# Patient Record
Sex: Male | Born: 1990 | Race: White | Hispanic: No | Marital: Single | Smoking: Current every day smoker
Health system: Southern US, Community
[De-identification: ages and names within clinical notes are randomized; demographics above are authoritative.]

## PROBLEM LIST (undated history)

## (undated) HISTORY — PX: TONSILLECTOMY: SUR1361

## (undated) HISTORY — PX: TYMPANOPLASTY: SHX33

## (undated) HISTORY — PX: CHEST TUBE INSERTION: SHX231

---
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
## 2012-11-16 ENCOUNTER — Emergency Department: Payer: Self-pay | Admitting: Emergency Medicine

## 2012-11-16 LAB — COMPREHENSIVE METABOLIC PANEL
Alkaline Phosphatase: 82 U/L (ref 50–136)
BUN: 13 mg/dL (ref 7–18)
Calcium, Total: 9.6 mg/dL (ref 8.5–10.1)
Creatinine: 1.33 mg/dL — ABNORMAL HIGH (ref 0.60–1.30)
Glucose: 63 mg/dL — ABNORMAL LOW (ref 65–99)
Osmolality: 278 (ref 275–301)
SGPT (ALT): 23 U/L (ref 12–78)
Sodium: 140 mmol/L (ref 136–145)

## 2012-11-16 LAB — CBC WITH DIFFERENTIAL/PLATELET
Basophil #: 0.1 10*3/uL (ref 0.0–0.1)
Basophil %: 0.9 %
Eosinophil #: 0.2 10*3/uL (ref 0.0–0.7)
Eosinophil %: 2.5 %
HCT: 48.2 % (ref 40.0–52.0)
HGB: 16.5 g/dL (ref 13.0–18.0)
Lymphocyte #: 3.4 10*3/uL (ref 1.0–3.6)
Lymphocyte %: 38.8 %
MCH: 30.6 pg (ref 26.0–34.0)
Monocyte #: 0.6 x10 3/mm (ref 0.2–1.0)
Monocyte %: 6.9 %
Neutrophil %: 50.9 %
RBC: 5.4 10*6/uL (ref 4.40–5.90)
RDW: 13.3 % (ref 11.5–14.5)
WBC: 8.7 10*3/uL (ref 3.8–10.6)

## 2012-11-16 LAB — AMYLASE: Amylase: 54 U/L (ref 25–115)

## 2012-11-16 LAB — URINALYSIS, COMPLETE
Bacteria: NONE SEEN
Bilirubin,UR: NEGATIVE
Blood: NEGATIVE
Glucose,UR: NEGATIVE mg/dL (ref 0–75)
Ketone: NEGATIVE
Leukocyte Esterase: NEGATIVE
Ph: 7 (ref 4.5–8.0)
RBC,UR: 3 /HPF (ref 0–5)
WBC UR: NONE SEEN /HPF (ref 0–5)

## 2012-11-16 LAB — LIPASE, BLOOD: Lipase: 133 U/L (ref 73–393)

## 2013-01-17 ENCOUNTER — Emergency Department: Payer: Self-pay | Admitting: Emergency Medicine

## 2013-04-22 ENCOUNTER — Emergency Department: Payer: Self-pay | Admitting: Emergency Medicine

## 2013-08-09 ENCOUNTER — Emergency Department: Payer: Self-pay | Admitting: Emergency Medicine

## 2013-08-29 ENCOUNTER — Emergency Department: Payer: Self-pay | Admitting: Emergency Medicine

## 2014-03-04 ENCOUNTER — Emergency Department: Payer: Self-pay | Admitting: Internal Medicine

## 2014-06-19 ENCOUNTER — Encounter: Payer: Self-pay | Admitting: Emergency Medicine

## 2014-06-19 ENCOUNTER — Emergency Department
Admission: EM | Admit: 2014-06-19 | Discharge: 2014-06-19 | Disposition: A | Discharge status: Home / Self Care | Payer: Self-pay | Attending: Emergency Medicine | Admitting: Emergency Medicine

## 2014-06-19 DIAGNOSIS — Z87891 Personal history of nicotine dependence: Secondary | ICD-10-CM | POA: Insufficient documentation

## 2014-06-19 DIAGNOSIS — K2970 Gastritis, unspecified, without bleeding: Secondary | ICD-10-CM | POA: Insufficient documentation

## 2014-06-19 DIAGNOSIS — R1031 Right lower quadrant pain: Secondary | ICD-10-CM

## 2014-06-19 LAB — CBC WITH DIFFERENTIAL/PLATELET
BASOS ABS: 0.1 10*3/uL (ref 0–0.1)
BASOS PCT: 1 %
Eosinophils Absolute: 0.3 10*3/uL (ref 0–0.7)
Eosinophils Relative: 3 %
HCT: 53.1 % — ABNORMAL HIGH (ref 40.0–52.0)
HEMOGLOBIN: 18 g/dL (ref 13.0–18.0)
Lymphocytes Relative: 34 %
Lymphs Abs: 3.6 10*3/uL (ref 1.0–3.6)
MCH: 30.6 pg (ref 26.0–34.0)
MCHC: 33.8 g/dL (ref 32.0–36.0)
MCV: 90.5 fL (ref 80.0–100.0)
MONO ABS: 0.6 10*3/uL (ref 0.2–1.0)
MONOS PCT: 6 %
NEUTROS ABS: 6.1 10*3/uL (ref 1.4–6.5)
Neutrophils Relative %: 56 %
Platelets: 241 10*3/uL (ref 150–440)
RBC: 5.87 MIL/uL (ref 4.40–5.90)
RDW: 13.2 % (ref 11.5–14.5)
WBC: 10.6 10*3/uL (ref 3.8–10.6)

## 2014-06-19 LAB — URINALYSIS COMPLETE WITH MICROSCOPIC (ARMC ONLY)
Bacteria, UA: NONE SEEN
Bilirubin Urine: NEGATIVE
Glucose, UA: NEGATIVE mg/dL
Hgb urine dipstick: NEGATIVE
Ketones, ur: NEGATIVE mg/dL
LEUKOCYTES UA: NEGATIVE
Nitrite: NEGATIVE
PH: 8 (ref 5.0–8.0)
Protein, ur: NEGATIVE mg/dL
Specific Gravity, Urine: 1.014 (ref 1.005–1.030)

## 2014-06-19 LAB — COMPREHENSIVE METABOLIC PANEL
ALBUMIN: 4.6 g/dL (ref 3.5–5.0)
ALT: 18 U/L (ref 17–63)
AST: 23 U/L (ref 15–41)
Alkaline Phosphatase: 61 U/L (ref 38–126)
Anion gap: 7 (ref 5–15)
BUN: 14 mg/dL (ref 6–20)
CALCIUM: 9.6 mg/dL (ref 8.9–10.3)
CO2: 27 mmol/L (ref 22–32)
Chloride: 104 mmol/L (ref 101–111)
Creatinine, Ser: 1.16 mg/dL (ref 0.61–1.24)
GFR calc Af Amer: >60 mL/min (ref >60)
GFR calc non Af Amer: >60 mL/min (ref >60)
Glucose, Bld: 89 mg/dL (ref 65–99)
Potassium: 4.3 mmol/L (ref 3.5–5.1)
SODIUM: 138 mmol/L (ref 135–145)
TOTAL PROTEIN: 7.7 g/dL (ref 6.5–8.1)
Total Bilirubin: 0.9 mg/dL (ref 0.3–1.2)

## 2014-06-19 MED ORDER — ONDANSETRON HCL 4 MG PO TABS: 4.0000 mg | ORAL_TABLET | Freq: Every day | ORAL | Status: DC | PRN

## 2014-06-19 NOTE — ED Provider Notes (Signed)
[facility] Emergency Department Provider Note  ____________________________________________  Time seen: On arrival  I have reviewed the triage vital signs and the nursing notes.   HISTORY  Chief Complaint Abdominal Pain; Nausea; Emesis; and Dysuria      HPI Michael Calderon is a 24 y.o. male who reports that he had right lower abdominal discomfort yesterday that was moderate but has now resolved. He describes as a cramping sensation. He had some nausea and vomiting but no diarrhea. He feels fine now but he wanted to get checked out. He has no history of surgeries in his abdomen. No fevers no chills. Normal bowel movements     No past medical history on file.  There are no active problems to display for this patient.   No past surgical history on file.  Current Outpatient Rx  Name  Route  Sig  Dispense  Refill  . ondansetron (ZOFRAN) 4 MG tablet   Oral   Take 1 tablet (4 mg total) by mouth daily as needed for nausea or vomiting.   20 tablet   1     Allergies Sulfa antibiotics  No family history on file.  Social History History  Substance Use Topics  . Smoking status: Former Games developer  . Smokeless tobacco: Current User    Types: Chew  . Alcohol Use: No    Review of Systems  Constitutional: Negative for fever. Eyes: Negative for visual changes. ENT: Negative for sore throat Cardiovascular: Negative for chest pain. Respiratory: Negative for shortness of breath. Gastrointestinal: Positive for abdominal pain Genitourinary: Negative for dysuria. Musculoskeletal: Negative for back pain. Skin: Negative for rash. Neurological: Negative for headaches or focal weakness   10-point ROS otherwise negative.  ____________________________________________   PHYSICAL EXAM:  VITAL SIGNS: ED Triage Vitals  Enc Vitals Group     BP 06/19/14 1718 112/82 mmHg     Pulse Rate 06/19/14 1718 60     Resp 06/19/14 1718 18     Temp 06/19/14 1718  98.2 F (36.8 C)     Temp Source 06/19/14 1718 Oral     SpO2 06/19/14 1718 97 %     Weight 06/19/14 1718 185 lb (83.915 kg)     Height 06/19/14 1718 5\' 7"  (1.702 m)     Head Cir --      Peak Flow --      Pain Score 06/19/14 1719 0     Pain Loc --      Pain Edu? --      Excl. in GC? --     Constitutional: Alert and oriented. Well appearing and in no distress. Eyes: Conjunctivae are normal. PERRL. ENT   Head: Normocephalic and atraumatic.   Nose: No rhinnorhea.   Mouth/Throat: Mucous membranes are moist. Cardiovascular: Normal rate, regular rhythm. Normal and symmetric distal pulses are present in all extremities. No murmurs, rubs, or gallops. Respiratory: Normal respiratory effort without tachypnea nor retractions. Breath sounds are clear and equal bilaterally.  Gastrointestinal: Soft and non-tender in all quadrants. No distention. There is no CVA tenderness. Genitourinary: deferred Musculoskeletal: Nontender with normal range of motion in all extremities. No lower extremity tenderness nor edema. Neurologic:  Normal speech and language. No gross focal neurologic deficits are appreciated. Skin:  Skin is warm, dry and intact. No rash noted. Psychiatric: Mood and affect are normal. Patient exhibits appropriate insight and judgment.  ____________________________________________    LABS (pertinent positives/negatives)  Labs Reviewed  URINALYSIS COMPLETEWITH MICROSCOPIC ([facility] ONLY) - Abnormal; Notable  for the following:    Color, Urine YELLOW (*)    APPearance CLEAR (*)    Squamous Epithelial / LPF 0-5 (*)    All other components within normal limits  CBC WITH DIFFERENTIAL/PLATELET - Abnormal; Notable for the following:    HCT 53.1 (*)    All other components within normal limits  COMPREHENSIVE METABOLIC PANEL    ____________________________________________   EKG  None  ____________________________________________     RADIOLOGY  None  ____________________________________________   PROCEDURES  Procedure(s) performed: none  Critical Care performed: none  ____________________________________________   INITIAL IMPRESSION / ASSESSMENT AND PLAN / ED COURSE  Pertinent labs & imaging results that were available during my care of the patient were reviewed by me and considered in my medical decision making (see chart for details).  Patient a symptomatically in the emergency department. His labs are normal. He feels well, all vitals stable, I will discharge with PCP follow-up as needed  ____________________________________________   FINAL CLINICAL IMPRESSION(S) / ED DIAGNOSES  Final diagnoses:  Gastritis  Right lower quadrant abdominal pain     Jene Every, MD 06/19/14 2002

## 2014-06-19 NOTE — Discharge Instructions (Signed)

## 2014-06-19 NOTE — ED Notes (Signed)
Pt to ED with c/o RLQ abd pain and right flank pain with n,v since yesterday, states pain is worse with urination

## 2014-11-07 ENCOUNTER — Emergency Department: Payer: Self-pay

## 2014-11-07 ENCOUNTER — Encounter: Payer: Self-pay | Admitting: Emergency Medicine

## 2014-11-07 ENCOUNTER — Emergency Department
Admission: EM | Admit: 2014-11-07 | Discharge: 2014-11-07 | Disposition: A | Discharge status: Home / Self Care | Payer: Self-pay | Attending: Emergency Medicine | Admitting: Emergency Medicine

## 2014-11-07 DIAGNOSIS — R1110 Vomiting, unspecified: Secondary | ICD-10-CM | POA: Insufficient documentation

## 2014-11-07 DIAGNOSIS — J069 Acute upper respiratory infection, unspecified: Secondary | ICD-10-CM | POA: Insufficient documentation

## 2014-11-07 DIAGNOSIS — Z87891 Personal history of nicotine dependence: Secondary | ICD-10-CM | POA: Insufficient documentation

## 2014-11-07 DIAGNOSIS — J0110 Acute frontal sinusitis, unspecified: Secondary | ICD-10-CM | POA: Insufficient documentation

## 2014-11-07 MED ORDER — PREDNISONE 20 MG PO TABS
60.0000 mg | ORAL_TABLET | Freq: Once | ORAL | Status: AC
  Administered 2014-11-07: 60 mg via ORAL
  Filled 2014-11-07: qty 3

## 2014-11-07 MED ORDER — PREDNISONE 10 MG PO TABS: 50.0000 mg | ORAL_TABLET | Freq: Every day | ORAL | Status: DC

## 2014-11-07 MED ORDER — AZITHROMYCIN 250 MG PO TABS: ORAL_TABLET | ORAL | Status: DC

## 2014-11-07 MED ORDER — GUAIFENESIN-CODEINE 100-10 MG/5ML PO SYRP: 10.0000 mL | ORAL_SOLUTION | Freq: Three times a day (TID) | ORAL | Status: DC | PRN

## 2014-11-07 MED ORDER — HYDROCOD POLST-CPM POLST ER 10-8 MG/5ML PO SUER
5.0000 mL | Freq: Once | ORAL | Status: AC
  Administered 2014-11-07: 5 mL via ORAL
  Filled 2014-11-07: qty 5

## 2014-11-07 NOTE — ED Provider Notes (Signed)
[facility] Emergency Department Provider Note ____________________________________________  Time seen: Approximately 10:47 PM  I have reviewed the triage vital signs and the nursing notes.   HISTORY  Chief Complaint Cough and Emesis   HPI Michael Calderon is a 24 y.o. male who presents to the emergency department for evaluation of cough, nasal congestion, headache, cough, and intermittent fever for the past week. Cough is persistent and causes vomiting sometimes. No relief with OTC meds.  History reviewed. No pertinent past medical history.  There are no active problems to display for this patient.   Past Surgical History  Procedure Laterality Date  . Chest tube insertion    . Tympanoplasty      Current Outpatient Rx  Name  Route  Sig  Dispense  Refill  . azithromycin (ZITHROMAX) 250 MG tablet      2 tablets today, then 1 tablet for the next 4 days.   6 each   0   . guaiFENesin-codeine (ROBITUSSIN AC) 100-10 MG/5ML syrup   Oral   Take 10 mLs by mouth 3 (three) times daily as needed for cough.   120 mL   0   . ondansetron (ZOFRAN) 4 MG tablet   Oral   Take 1 tablet (4 mg total) by mouth daily as needed for nausea or vomiting.   20 tablet   1   . predniSONE (DELTASONE) 10 MG tablet   Oral   Take 5 tablets (50 mg total) by mouth daily.   25 tablet   0     Allergies Sulfa antibiotics  No family history on file.  Social History Social History  Substance Use Topics  . Smoking status: Former Games developermoker  . Smokeless tobacco: Current User    Types: Chew  . Alcohol Use: Yes    Review of Systems Constitutional: No fever/chills Eyes: No visual changes. ENT: No sore throat. Cardiovascular: Denies chest pain. Respiratory: Negative for shortness of breath. Positive for cough. Gastrointestinal: Negative for abdominal pain. Negative for nausea,  posttussive vomiting.  Negative for diarrhea.  Genitourinary: Negative for  dysuria. Musculoskeletal: Positive for body aches Skin: Negative for rash. Neurological: Positive for headaches, Negative for focal weakness or numbness.  10-point ROS otherwise negative.  ____________________________________________   PHYSICAL EXAM:  VITAL SIGNS: ED Triage Vitals  Enc Vitals Group     BP 11/07/14 2212 142/66 mmHg     Pulse Rate 11/07/14 2212 71     Resp --      Temp 11/07/14 2212 97.5 F (36.4 C)     Temp Source 11/07/14 2212 Oral     SpO2 11/07/14 2212 97 %     Weight 11/07/14 2212 180 lb (81.647 kg)     Height 11/07/14 2212 5\' 7"  (1.702 m)     Head Cir --      Peak Flow --      Pain Score 11/07/14 2212 6     Pain Loc --      Pain Edu? --      Excl. in GC? --     Constitutional: Alert and oriented. Well appearing and in no acute distress. Eyes: Conjunctivae are normal. PERRL. EOMI. Head: Atraumatic. Nose: No congestion/rhinnorhea. Mouth/Throat: Mucous membranes are moist.  Oropharynx non-erythematous. Neck: No stridor.  Lymphatic: No cervical lymphadenopathy. Cardiovascular: Normal rate, regular rhythm. Grossly normal heart sounds.  Good peripheral circulation. Respiratory: Normal respiratory effort.  No retractions. Expiratory wheeze noted in the right base Gastrointestinal: Soft and nontender. No distention. No  abdominal bruits. No CVA tenderness. Musculoskeletal: No joint pain reported. Neurologic:  Normal speech and language. No gross focal neurologic deficits are appreciated. Speech is normal. No gait instability. Skin:  Skin is warm, dry and intact. No rash noted. Psychiatric: Mood and affect are normal. Speech and behavior are normal.  ____________________________________________   LABS (all labs ordered are listed, but only abnormal results are displayed)  Labs Reviewed - No data to display ____________________________________________  EKG   ____________________________________________  RADIOLOGY  Chest x-ray negative for acute  cardiopulmonary abnormality. ____________________________________________   PROCEDURES  Procedure(s) performed: None  Critical Care performed: No  ____________________________________________   INITIAL IMPRESSION / ASSESSMENT AND PLAN / ED COURSE  Pertinent labs & imaging results that were available during my care of the patient were reviewed by me and considered in my medical decision making (see chart for details).   Patient was encouraged to follow up with the primary care provider of his choice for symptoms that are not improving over the next 2-3 days. He is advised to return to the emergency department for symptoms that change or worsen if unable to schedule an appointment. ____________________________________________   FINAL CLINICAL IMPRESSION(S) / ED DIAGNOSES  Final diagnoses:  Acute frontal sinusitis, recurrence not specified  Upper respiratory tract infection       Chinita Pester, FNP 11/07/14 2258  Darien Ramus, MD 11/07/14 2332

## 2014-11-07 NOTE — ED Notes (Signed)
Pt presents to ED with nasal congestion and non-productive cough for the past week. Denies fever. Pt states he coughs so much he is vomiting when he eats. Pt has no increased work of breathing or acute distress noted at this time. Mask applied.

## 2014-11-07 NOTE — Discharge Instructions (Signed)
Cool Mist Vaporizers  Vaporizers may help relieve the symptoms of a cough and cold. They add moisture to the air, which helps mucus to become thinner and less sticky. This makes it easier to breathe and cough up secretions. Cool mist vaporizers do not cause serious burns like hot mist vaporizers, which may also be called steamers or humidifiers. Vaporizers have not been proven to help with colds. You should not use a vaporizer if you are allergic to mold.  HOME CARE INSTRUCTIONS  · Follow the package instructions for the vaporizer.  · Do not use anything other than distilled water in the vaporizer.  · Do not run the vaporizer all of the time. This can cause mold or bacteria to grow in the vaporizer.  · Clean the vaporizer after each time it is used.  · Clean and dry the vaporizer well before storing it.  · Stop using the vaporizer if worsening respiratory symptoms develop.     This information is not intended to replace advice given to you by your health care provider. Make sure you discuss any questions you have with your health care provider.     Document Released: 09/20/2003 Document Revised: 12/28/2012 Document Reviewed: 05/12/2012  Elsevier Interactive Patient Education ©2016 Elsevier Inc.

## 2014-12-18 ENCOUNTER — Emergency Department
Admission: EM | Admit: 2014-12-18 | Discharge: 2014-12-18 | Disposition: A | Discharge status: Home / Self Care | Payer: Self-pay | Attending: Emergency Medicine | Admitting: Emergency Medicine

## 2014-12-18 ENCOUNTER — Encounter: Payer: Self-pay | Admitting: Urgent Care

## 2014-12-18 DIAGNOSIS — Z87891 Personal history of nicotine dependence: Secondary | ICD-10-CM | POA: Insufficient documentation

## 2014-12-18 DIAGNOSIS — Z792 Long term (current) use of antibiotics: Secondary | ICD-10-CM | POA: Insufficient documentation

## 2014-12-18 DIAGNOSIS — L03119 Cellulitis of unspecified part of limb: Secondary | ICD-10-CM

## 2014-12-18 DIAGNOSIS — L03116 Cellulitis of left lower limb: Secondary | ICD-10-CM | POA: Insufficient documentation

## 2014-12-18 DIAGNOSIS — R000 Tachycardia, unspecified: Secondary | ICD-10-CM | POA: Insufficient documentation

## 2014-12-18 DIAGNOSIS — Z7952 Long term (current) use of systemic steroids: Secondary | ICD-10-CM | POA: Insufficient documentation

## 2014-12-18 DIAGNOSIS — F419 Anxiety disorder, unspecified: Secondary | ICD-10-CM | POA: Insufficient documentation

## 2014-12-18 DIAGNOSIS — B353 Tinea pedis: Secondary | ICD-10-CM | POA: Insufficient documentation

## 2014-12-18 MED ORDER — SODIUM CHLORIDE 0.9 % IV BOLUS (SEPSIS)
1000.0000 mL | Freq: Once | INTRAVENOUS | Status: AC
  Administered 2014-12-18: 1000 mL via INTRAVENOUS

## 2014-12-18 MED ORDER — FLUCONAZOLE 150 MG PO TABS: 150.0000 mg | ORAL_TABLET | ORAL | Status: AC

## 2014-12-18 MED ORDER — CEPHALEXIN 500 MG PO CAPS: 500.0000 mg | ORAL_CAPSULE | Freq: Two times a day (BID) | ORAL | Status: DC

## 2014-12-18 NOTE — ED Provider Notes (Signed)
[facility] Emergency Department Provider Note  ____________________________________________  Time seen: On arrival  I have reviewed the triage vital signs and the nursing notes.   HISTORY  Chief Complaint Foot Pain    HPI HANZEL PIZZO is a 24 y.o. male who presents with redness and swelling to the left foot. He reports this started this morning and he has never had this before. Describes a burning sensation in the top of his foot. No Calderon or chills. Does admit to recent hospitalization [facility] for separate issues. He also reports a long history of athlete's foot and he has tried creams without success. He denies a history of diabetes.     History reviewed. No pertinent past medical history.  There are no active problems to display for this patient.   Past Surgical History  Procedure Laterality Date  . Chest tube insertion    . Tympanoplasty    . Tonsillectomy      Current Outpatient Rx  Name  Route  Sig  Dispense  Refill  . azithromycin (ZITHROMAX) 250 MG tablet      2 tablets today, then 1 tablet for the next 4 days.   6 each   0   . cephALEXin (KEFLEX) 500 MG capsule   Oral   Take 1 capsule (500 mg total) by mouth 2 (two) times daily.   14 capsule   0   . fluconazole (DIFLUCAN) 150 MG tablet   Oral   Take 1 tablet (150 mg total) by mouth once a week.   4 tablet   0   . guaiFENesin-codeine (ROBITUSSIN AC) 100-10 MG/5ML syrup   Oral   Take 10 mLs by mouth 3 (three) times daily as needed for cough.   120 mL   0   . ondansetron (ZOFRAN) 4 MG tablet   Oral   Take 1 tablet (4 mg total) by mouth daily as needed for nausea or vomiting.   20 tablet   1   . predniSONE (DELTASONE) 10 MG tablet   Oral   Take 5 tablets (50 mg total) by mouth daily.   25 tablet   0     Allergies Sulfa antibiotics  No family history on file.  Social History Social History  Substance Use Topics  . Smoking status: Former Games developer  .  Smokeless tobacco: Current User    Types: Chew  . Alcohol Use: Yes    Review of Systems  Constitutional: Negative for fever. Eyes: Negative for visual changes. ENT: Negative for sore throat  Respiratory: Negative for cough Gastrointestinal: Negative for abdominal pain Genitourinary: Negative for dysuria. Musculoskeletal: Negative for back pain. Skin: Foot rash as above Neurological: Negative for headaches Psychiatric: Mild anxiety    ____________________________________________   PHYSICAL EXAM:  VITAL SIGNS: ED Triage Vitals  Enc Vitals Group     BP 12/18/14 2106 148/91 mmHg     Pulse Rate 12/18/14 2106 122     Resp 12/18/14 2106 18     Temp 12/18/14 2106 98.1 F (36.7 C)     Temp Source 12/18/14 2106 Oral     SpO2 12/18/14 2106 99 %     Weight 12/18/14 2106 165 lb (74.844 kg)     Height 12/18/14 2106  (1.702 m)     Head Cir --      Peak Flow --      Pain Score 12/18/14 2107 10     Pain Loc --  Pain Edu? --      Excl. in GC? --      Constitutional: Alert and oriented. Well appearing and in no distress. Eyes: Conjunctivae are normal.  ENT   Head: Normocephalic and atraumatic.   Mouth/Throat: Mucous membranes are moist. Cardiovascular: Tachycardia, regular rhythm. Normal and symmetric distal pulses are present in all extremities.  Respiratory: Normal respiratory effort without tachypnea nor retractions. Breath sounds are clear and equal bilaterally.   Genitourinary: deferred Musculoskeletal: Nontender with normal range of motion in all extremities.  Neurologic:  Normal speech and language. No gross focal neurologic deficits are appreciated. Skin:  Skin is warm, dry and intact. Erythema to the dorsum of the left foot. Does not involve the toes. Significant tinea pedis with bilateral toes. Mild edema to the left foot. No obvious skin break. No streaking. No crepitus Psychiatric: Mood and affect are normal. Patient exhibits appropriate insight and  judgment.  ____________________________________________    LABS (pertinent positives/negatives)  Labs Reviewed - No data to display  ____________________________________________   EKG  None  ____________________________________________    RADIOLOGY I have personally reviewed any xrays that were ordered on this patient: None  ____________________________________________   PROCEDURES  Procedure(s) performed: none  Critical Care performed: none  ____________________________________________   INITIAL IMPRESSION / ASSESSMENT AND PLAN / ED COURSE  Pertinent labs & imaging results that were available during my care of the patient were reviewed by me and considered in my medical decision making (see chart for details).  Exam consistent with mild cellulitis to the dorsum of the left foot. No history of diabetes, outpatient antibiotics are appropriate. He does have a mildly elevated heart rate for which we will give fluids. He is afebrile and nontoxic appearing.  ----------------------------------------- 10:21 PM on 12/18/2014 -----------------------------------------  Heart rate 99 and fluids. Patient well-appearing and agrees with plan. He will return for recheck  ____________________________________________   FINAL CLINICAL IMPRESSION(S) / ED DIAGNOSES  Final diagnoses:  Cellulitis of foot  Tinea pedis of both feet     Jene Everyobert Huckleberry Martinson, MD 12/18/14 2221

## 2014-12-18 NOTE — ED Notes (Signed)
Left foot swelling began today, D[facility], for food poisoning?

## 2014-12-18 NOTE — Discharge Instructions (Signed)
Cellulitis Cellulitis is an infection of the skin and the tissue beneath it. The infected area is usually red and tender. Cellulitis occurs most often in the arms and lower legs.  CAUSES  Cellulitis is caused by bacteria that enter the skin through cracks or cuts in the skin. The most common types of bacteria that cause cellulitis are staphylococci and streptococci. SIGNS AND SYMPTOMS   Redness and warmth.  Swelling.  Tenderness or pain.  Fever. DIAGNOSIS  Your health care provider can usually determine what is wrong based on a physical exam. Blood tests may also be done. TREATMENT  Treatment usually involves taking an antibiotic medicine. HOME CARE INSTRUCTIONS   Take your antibiotic medicine as directed by your health care provider. Finish the antibiotic even if you start to feel better.  Keep the infected arm or leg elevated to reduce swelling.  Apply a warm cloth to the affected area up to 4 times per day to relieve pain.  Take medicines only as directed by your health care provider.  Keep all follow-up visits as directed by your health care provider. SEEK MEDICAL CARE IF:   You notice red streaks coming from the infected area.  Your red area gets larger or turns dark in color.  Your bone or joint underneath the infected area becomes painful after the skin has healed.  Your infection returns in the same area or another area.  You notice a swollen bump in the infected area.  You develop new symptoms.  You have a fever. SEEK IMMEDIATE MEDICAL CARE IF:   You feel very sleepy.  You develop vomiting or diarrhea.  You have a general ill feeling (malaise) with muscle aches and pains.   This information is not intended to replace advice given to you by your health care provider. Make sure you discuss any questions you have with your health care provider.   Document Released: 10/02/2004 Document Revised: 09/13/2014 Document Reviewed: 03/10/2011 Elsevier Interactive  Patient Education 2016 [place] Athlete's foot (tinea pedis) is a fungal infection of the skin on the feet. It often occurs on the skin between the toes or underneath the toes. It can also occur on the soles of the feet. Athlete's foot is more likely to occur in hot, humid weather. Not washing your feet or changing your socks often enough can contribute to athlete's foot. The infection can spread from person to person (contagious). CAUSES Athlete's foot is caused by a fungus. This fungus thrives in warm, moist places. Most people get athlete's foot by sharing shower stalls, towels, and wet floors with an infected person. People with weakened immune systems, including those with diabetes, may be more likely to get athlete's foot. SYMPTOMS   Itchy areas between the toes or on the soles of the feet.  White, flaky, or scaly areas between the toes or on the soles of the feet.  Tiny, intensely itchy blisters between the toes or on the soles of the feet.  Tiny cuts on the skin. These cuts can develop a bacterial infection.  Thick or discolored toenails. DIAGNOSIS  Your caregiver can usually tell what the problem is by doing a physical exam. Your caregiver may also take a skin sample from the rash area. The skin sample may be examined under a microscope, or it may be tested to see if fungus will grow in the sample. A sample may also be taken from your toenail for testing. TREATMENT  Over-the-counter and prescription medicines can be  used to kill the fungus. These medicines are available as powders or creams. Your caregiver can suggest medicines for you. Fungal infections respond slowly to treatment. You may need to continue using your medicine for several weeks. PREVENTION   Do not share towels.  Wear sandals in wet areas, such as shared locker rooms and shared showers.  Keep your feet dry. Wear shoes that allow air to circulate. Wear cotton or wool socks. HOME CARE  INSTRUCTIONS   Take medicines as directed by your caregiver. Do not use steroid creams on athlete's foot.  Keep your feet clean and cool. Wash your feet daily and dry them thoroughly, especially between your toes.  Change your socks every day. Wear cotton or wool socks. In hot climates, you may need to change your socks 2 to 3 times per day.  Wear sandals or canvas tennis shoes with good air circulation.  If you have blisters, soak your feet in Burow's solution or Epsom salts for 20 to 30 minutes, 2 times a day to dry out the blisters. Make sure you dry your feet thoroughly afterward. SEEK MEDICAL CARE IF:   You have a fever.  You have swelling, soreness, warmth, or redness in your foot.  You are not getting better after 7 days of treatment.  You are not completely cured after 30 days.  You have any problems caused by your medicines. MAKE SURE YOU:   Understand these instructions.  Will watch your condition.  Will get help right away if you are not doing well or get worse.   This information is not intended to replace advice given to you by your health care provider. Make sure you discuss any questions you have with your health care provider.   Document Released: 12/21/1999 Document Revised: 03/17/2011 Document Reviewed: 06/26/2014 Elsevier Interactive Patient Education [employer]2016 Elsevier Inc.

## 2014-12-18 NOTE — ED Notes (Signed)
Patient presents with c/o pain, swelling, and redness to his LEFT foot. Patient advising that symptoms started yesterday.

## 2015-05-24 IMAGING — CR DG ABDOMEN 3V
1 series · 4 of 4 positions shown · non-contrast
Comparison: None.

CLINICAL DATA: Fever, cough, congestion.

EXAM:
ACUTE ABDOMEN SERIES

[Series 1: w chest pa · 0.14mm/px · 4 of 4 slices shown]
[im 1/4]
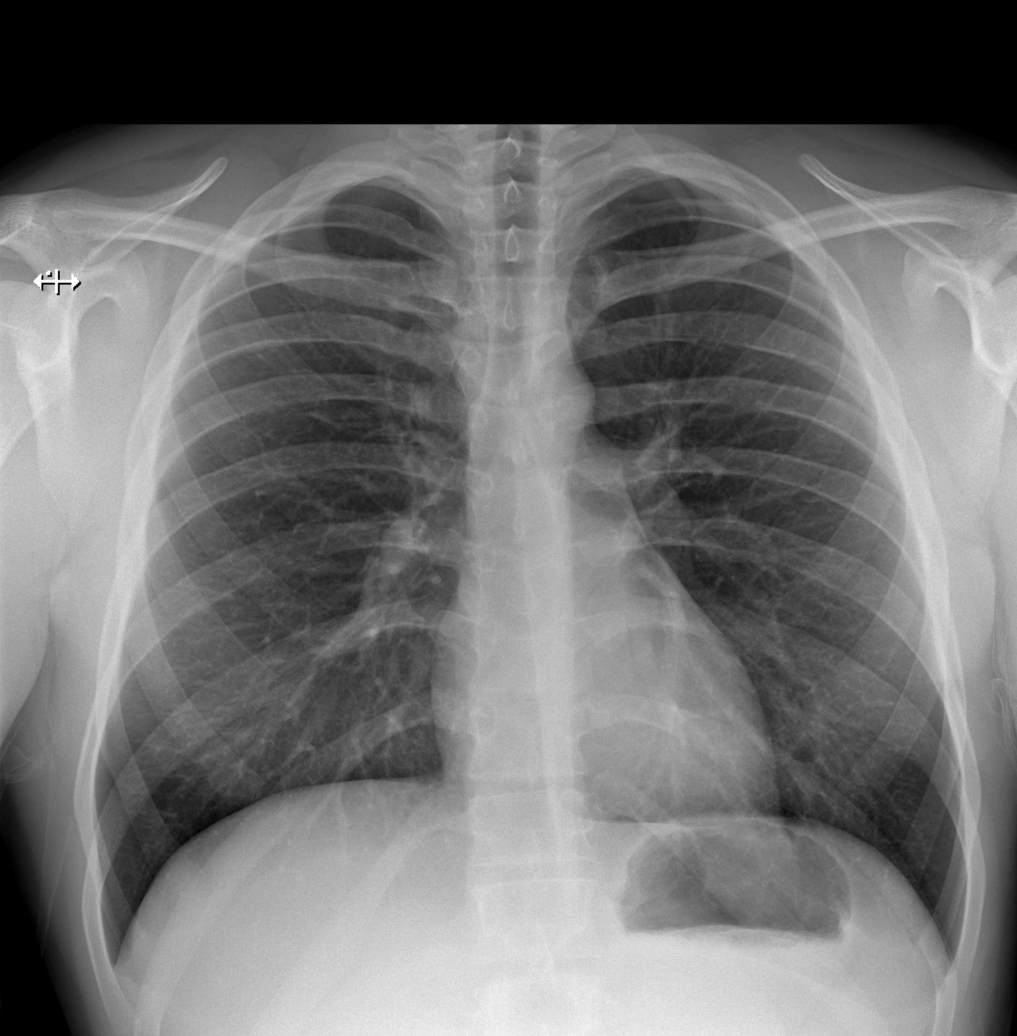
[im 2/4]
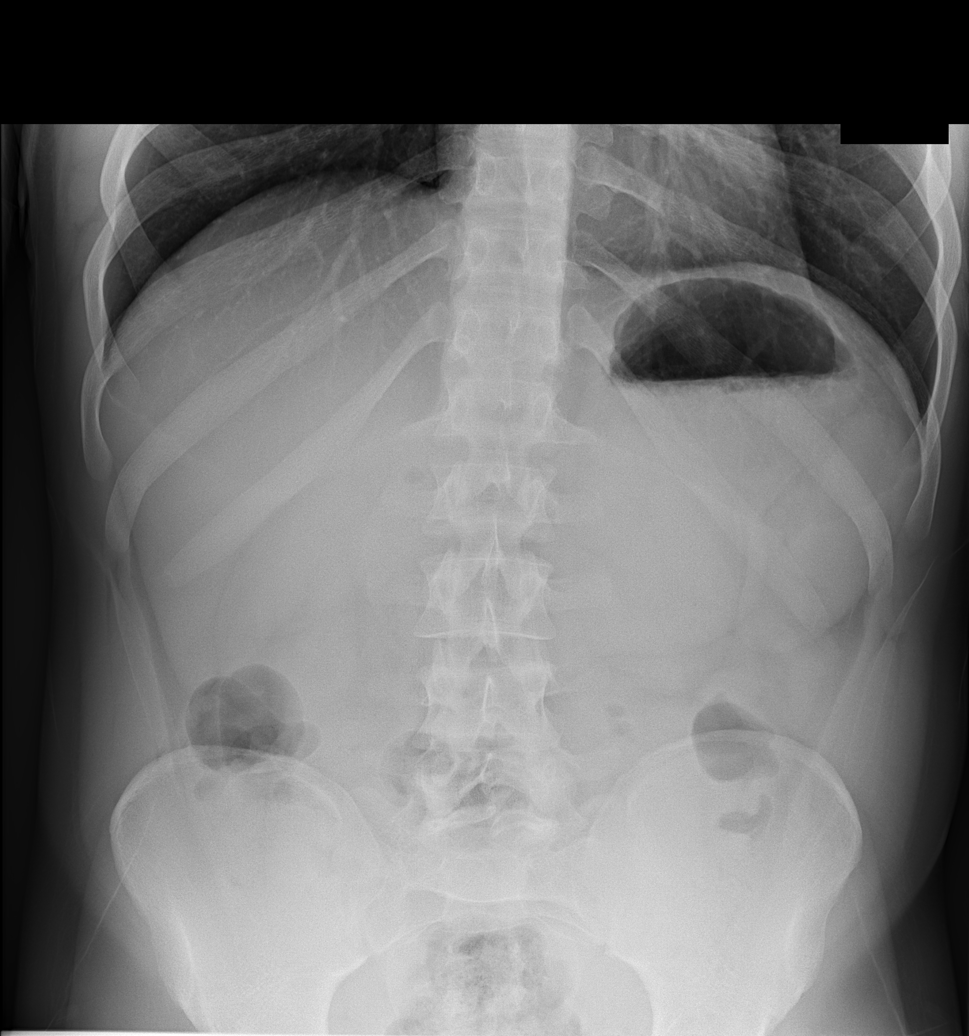
[im 3/4]
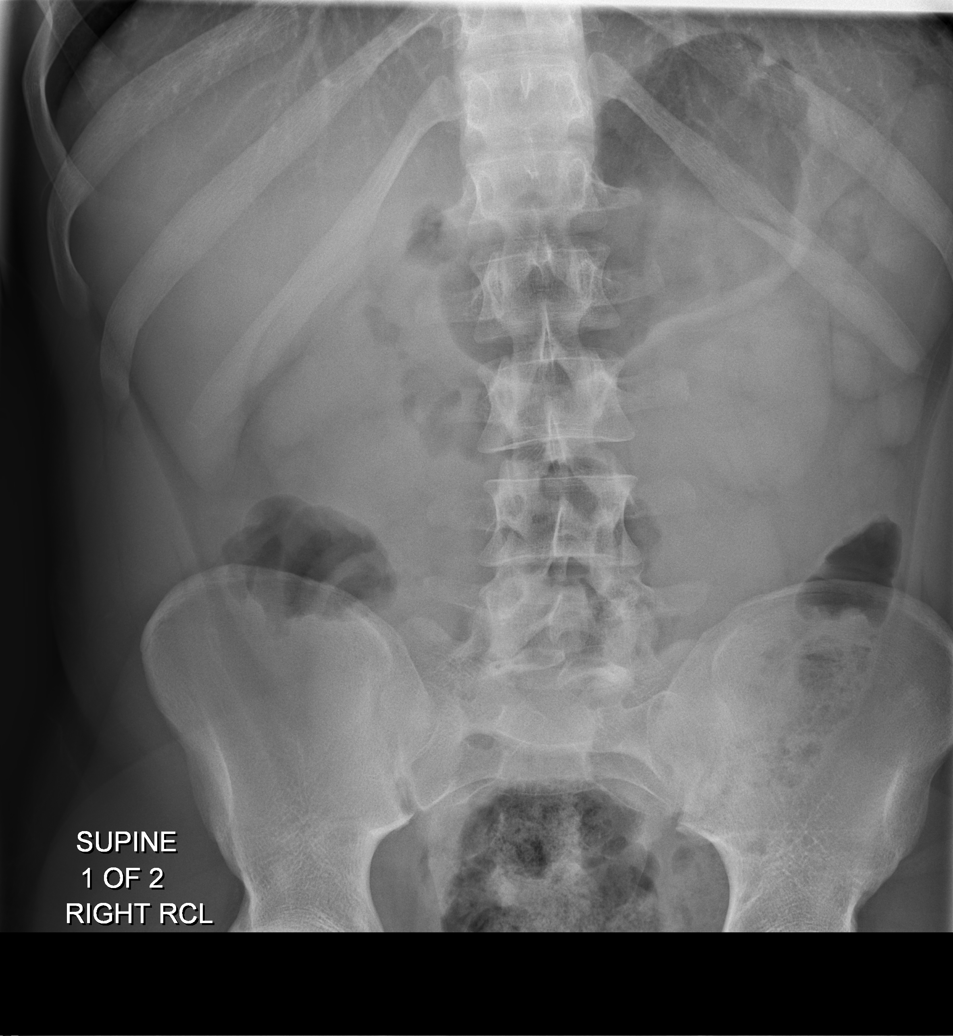
[im 4/4]
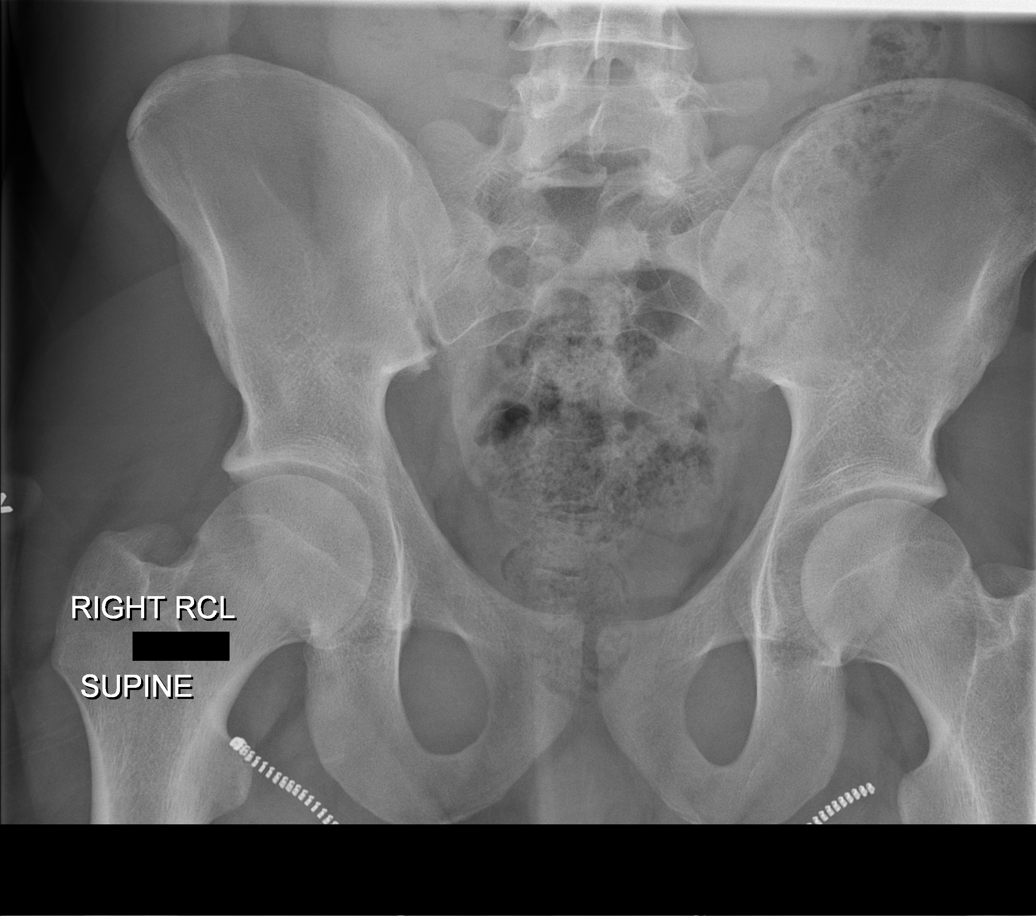

[4 of 4 positions shown; findings below may reference images not displayed]

FINDINGS: The bowel gas pattern is normal. There is no evidence of free
intraperitoneal air. No suspicious radio-opaque calculi or other
significant radiographic abnormality is seen. Heart size and
mediastinal contours are within normal limits. Both lungs are clear.
No effusions. No acute bony abnormality.
IMPRESSION: No acute findings.

## 2017-05-14 IMAGING — CR DG CHEST 2V
2 series · 3 of 3 positions shown · non-contrast
Comparison: None.

CLINICAL DATA: Nonproductive cough of 1 week duration.

EXAM:
CHEST  2 VIEW

[chest pa]
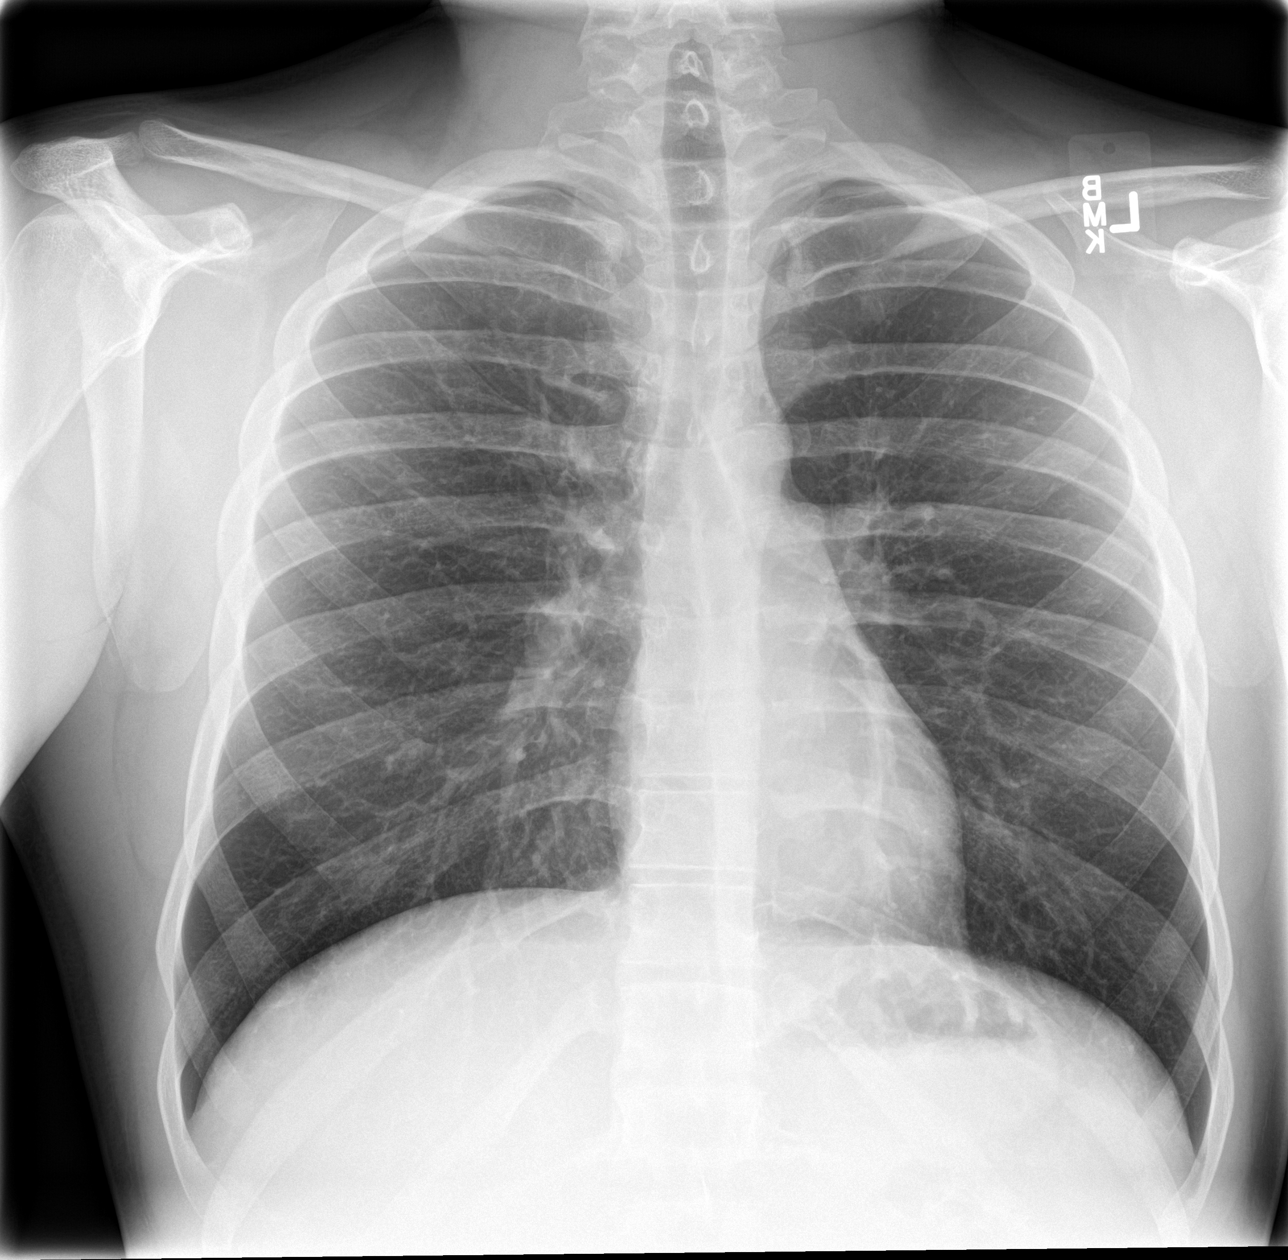

[Series 2: chest lat · 0.14mm/px · 2 of 2 slices shown]
[im 1/2]
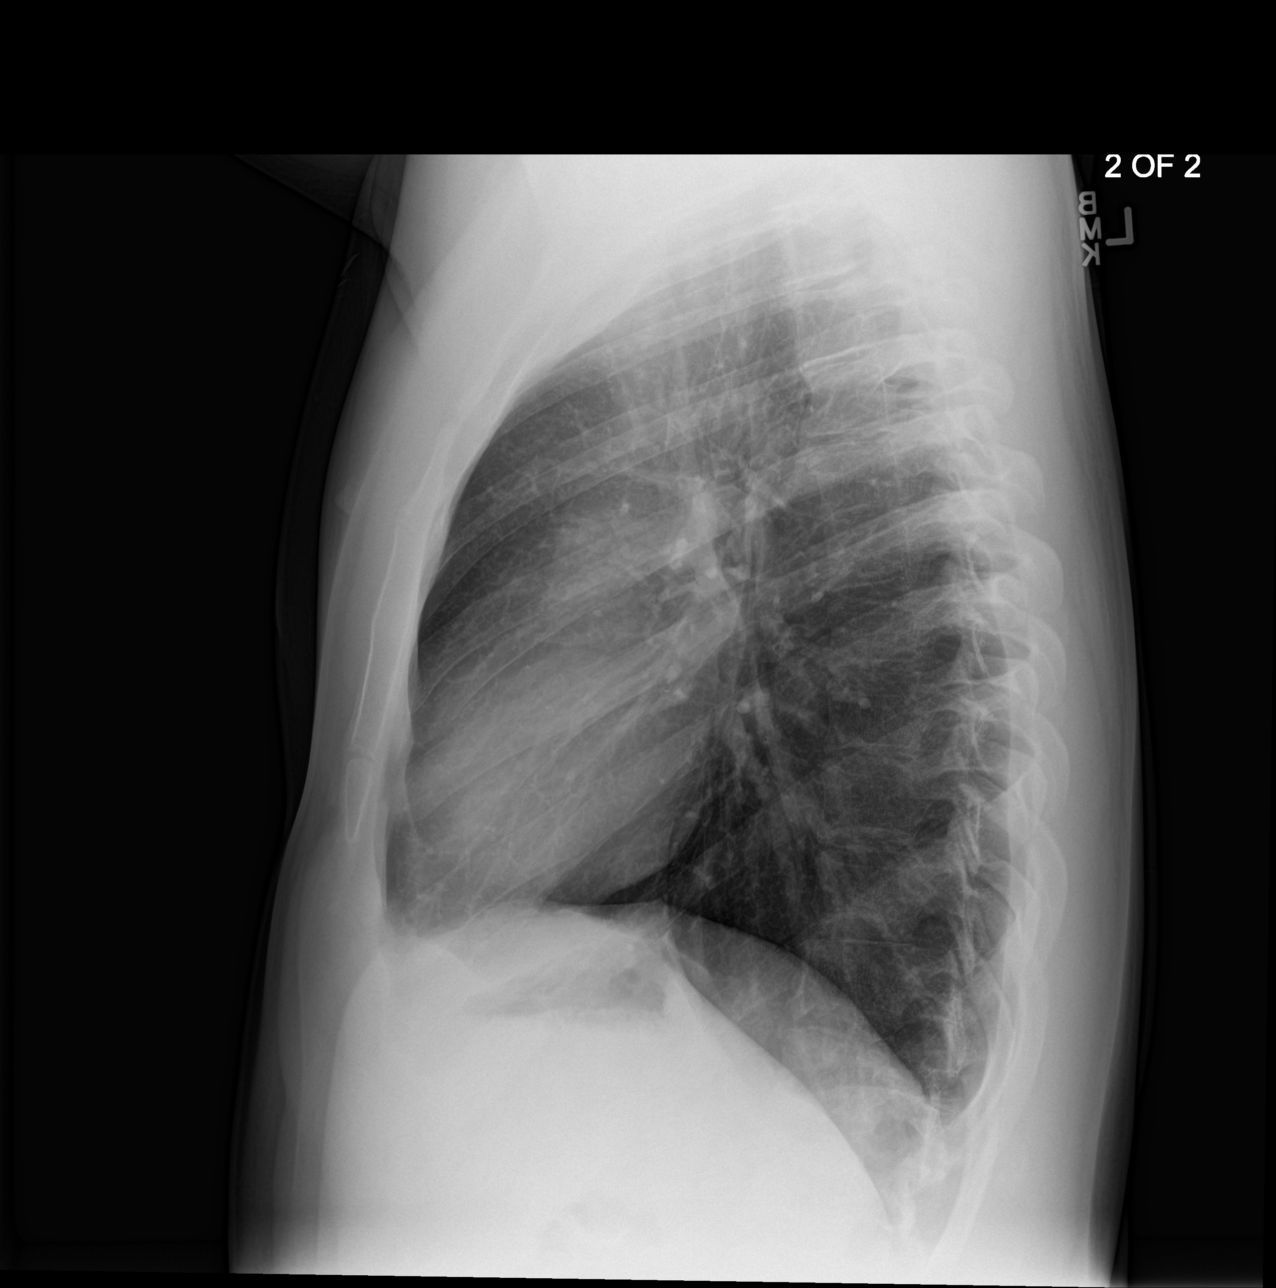
[im 2/2]
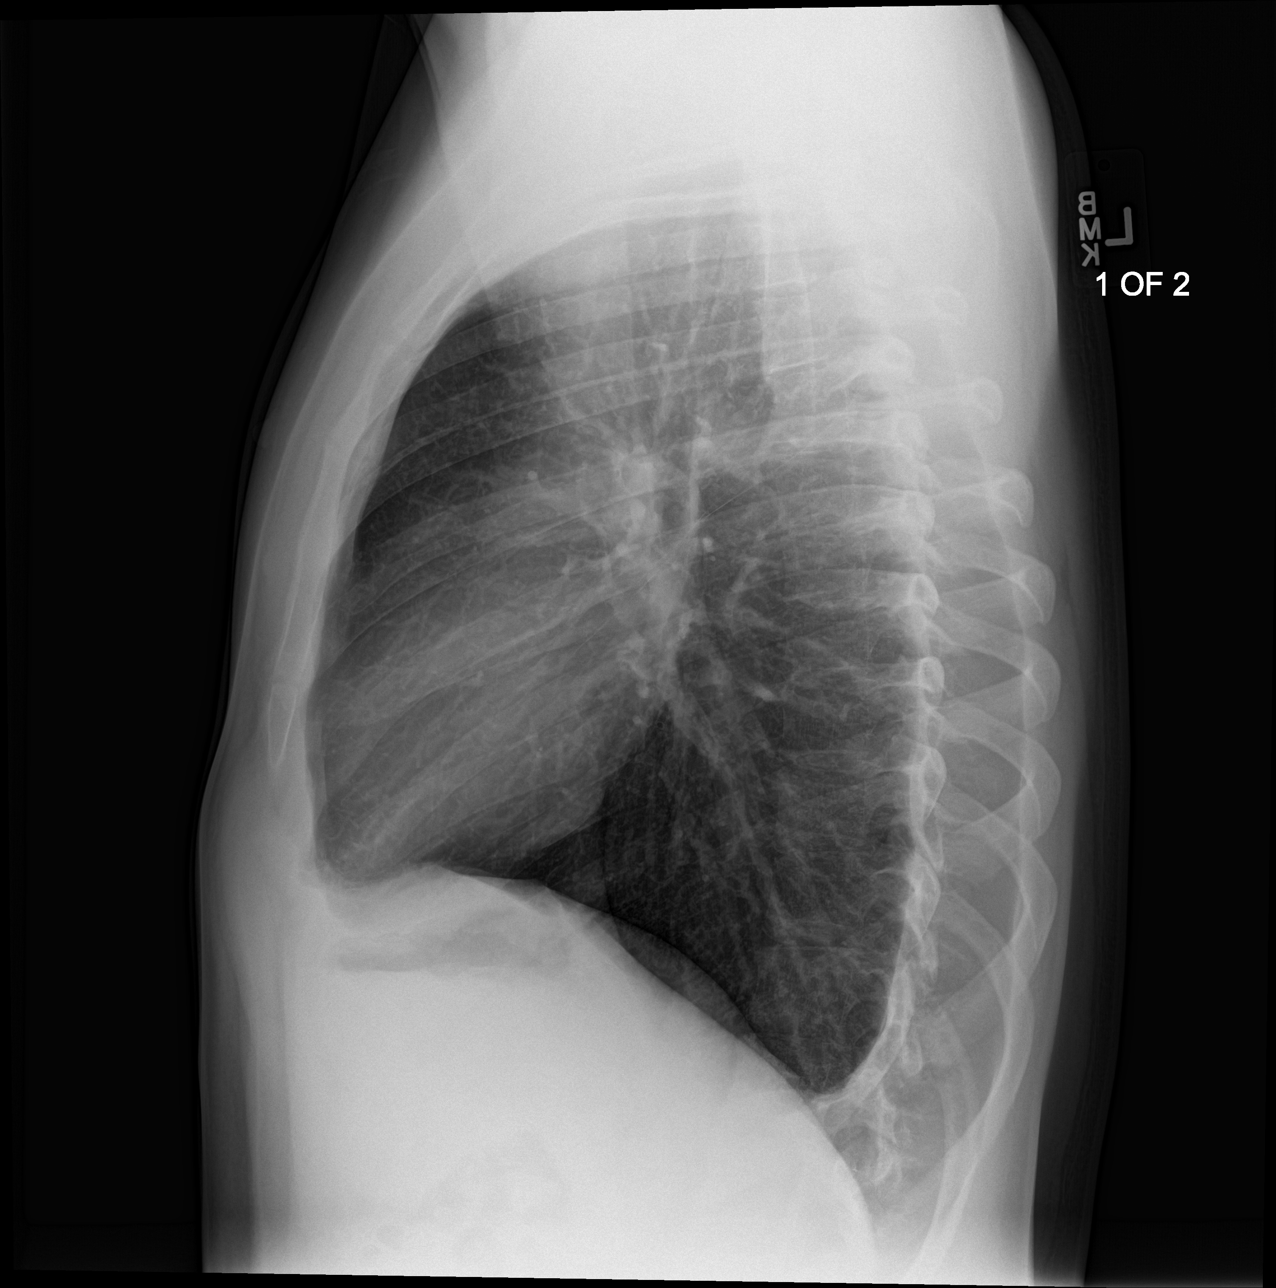

[3 of 3 positions shown; findings below may reference images not displayed]

FINDINGS: The heart size and mediastinal contours are within normal limits.
Both lungs are clear. The visualized skeletal structures are
unremarkable.
IMPRESSION: No active cardiopulmonary disease.

## 2018-06-28 ENCOUNTER — Other Ambulatory Visit: Payer: Self-pay

## 2018-06-28 ENCOUNTER — Encounter: Payer: Self-pay | Admitting: Emergency Medicine

## 2018-06-28 ENCOUNTER — Emergency Department
Admission: EM | Admit: 2018-06-28 | Discharge: 2018-06-28 | Disposition: A | Discharge status: Home / Self Care | Payer: No Typology Code available for payment source | Attending: Emergency Medicine | Admitting: Emergency Medicine

## 2018-06-28 DIAGNOSIS — F17200 Nicotine dependence, unspecified, uncomplicated: Secondary | ICD-10-CM | POA: Insufficient documentation

## 2018-06-28 DIAGNOSIS — T401X1A Poisoning by heroin, accidental (unintentional), initial encounter: Secondary | ICD-10-CM | POA: Diagnosis not present
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
## 2018-06-28 NOTE — ED Notes (Signed)
Dr kinner at bedside. 
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
## 2018-06-28 NOTE — ED Notes (Signed)
Report from Valerie, RN 

## 2019-03-24 ENCOUNTER — Inpatient Hospital Stay
Admission: EM | Admit: 2019-03-24 | Discharge: 2019-03-27 | DRG: 917 | Discharge status: Against Medical Advice | Payer: Self-pay | Attending: Internal Medicine | Admitting: Internal Medicine

## 2019-03-24 ENCOUNTER — Encounter: Payer: Self-pay | Admitting: Emergency Medicine

## 2019-03-24 ENCOUNTER — Emergency Department: Payer: Self-pay

## 2019-03-24 DIAGNOSIS — F1190 Opioid use, unspecified, uncomplicated: Secondary | ICD-10-CM | POA: Diagnosis present

## 2019-03-24 DIAGNOSIS — E872 Acidosis, unspecified: Secondary | ICD-10-CM

## 2019-03-24 DIAGNOSIS — Z882 Allergy status to sulfonamides status: Secondary | ICD-10-CM

## 2019-03-24 DIAGNOSIS — F1290 Cannabis use, unspecified, uncomplicated: Secondary | ICD-10-CM | POA: Diagnosis present

## 2019-03-24 DIAGNOSIS — F17220 Nicotine dependence, chewing tobacco, uncomplicated: Secondary | ICD-10-CM | POA: Diagnosis present

## 2019-03-24 DIAGNOSIS — R7989 Other specified abnormal findings of blood chemistry: Secondary | ICD-10-CM

## 2019-03-24 DIAGNOSIS — R0902 Hypoxemia: Secondary | ICD-10-CM | POA: Diagnosis present

## 2019-03-24 DIAGNOSIS — I469 Cardiac arrest, cause unspecified: Secondary | ICD-10-CM

## 2019-03-24 DIAGNOSIS — T401X1A Poisoning by heroin, accidental (unintentional), initial encounter: Principal | ICD-10-CM

## 2019-03-24 DIAGNOSIS — Z20822 Contact with and (suspected) exposure to covid-19: Secondary | ICD-10-CM | POA: Diagnosis present

## 2019-03-24 DIAGNOSIS — R45851 Suicidal ideations: Secondary | ICD-10-CM | POA: Diagnosis present

## 2019-03-24 DIAGNOSIS — R778 Other specified abnormalities of plasma proteins: Secondary | ICD-10-CM | POA: Diagnosis present

## 2019-03-24 DIAGNOSIS — R739 Hyperglycemia, unspecified: Secondary | ICD-10-CM | POA: Diagnosis present

## 2019-03-24 DIAGNOSIS — R092 Respiratory arrest: Secondary | ICD-10-CM

## 2019-03-24 DIAGNOSIS — R45850 Homicidal ideations: Secondary | ICD-10-CM | POA: Diagnosis present

## 2019-03-24 LAB — BASIC METABOLIC PANEL
Anion gap: 12 (ref 5–15)
BUN: 23 mg/dL — ABNORMAL HIGH (ref 6–20)
CO2: 28 mmol/L (ref 22–32)
Calcium: 8.5 mg/dL — ABNORMAL LOW (ref 8.9–10.3)
Chloride: 100 mmol/L (ref 98–111)
Creatinine, Ser: 1.3 mg/dL — ABNORMAL HIGH (ref 0.61–1.24)
GFR calc Af Amer: >60 mL/min (ref >60)
GFR calc non Af Amer: >60 mL/min (ref >60)
Glucose, Bld: 241 mg/dL — ABNORMAL HIGH (ref 70–99)
Potassium: 4.2 mmol/L (ref 3.5–5.1)
Sodium: 140 mmol/L (ref 135–145)

## 2019-03-24 LAB — CBC WITH DIFFERENTIAL/PLATELET
Abs Immature Granulocytes: 0.03 10*3/uL (ref 0.00–0.07)
Basophils Absolute: 0.1 10*3/uL (ref 0.0–0.1)
Basophils Relative: 1 %
Eosinophils Absolute: 0.5 10*3/uL (ref 0.0–0.5)
Eosinophils Relative: 5 %
HCT: 41.8 % (ref 39.0–52.0)
Hemoglobin: 13.6 g/dL (ref 13.0–17.0)
Immature Granulocytes: 0 %
Lymphocytes Relative: 53 %
Lymphs Abs: 5.3 10*3/uL — ABNORMAL HIGH (ref 0.7–4.0)
MCH: 29.4 pg (ref 26.0–34.0)
MCHC: 32.5 g/dL (ref 30.0–36.0)
MCV: 90.5 fL (ref 80.0–100.0)
Monocytes Absolute: 0.6 10*3/uL (ref 0.1–1.0)
Monocytes Relative: 6 %
Neutro Abs: 3.5 10*3/uL (ref 1.7–7.7)
Neutrophils Relative %: 35 %
Platelets: 284 10*3/uL (ref 150–400)
RBC: 4.62 MIL/uL (ref 4.22–5.81)
RDW: 12.4 % (ref 11.5–15.5)
Smear Review: NORMAL
WBC: 10 10*3/uL (ref 4.0–10.5)
nRBC: 0 % (ref 0.0–0.2)

## 2019-03-24 LAB — LACTIC ACID, PLASMA
Lactic Acid, Venous: 2.6 mmol/L (ref 0.5–1.9)
Lactic Acid, Venous: 0.6 mmol/L (ref 0.5–1.9)

## 2019-03-24 LAB — ETHANOL: Alcohol, Ethyl (B): <10 mg/dL (ref <10)

## 2019-03-24 LAB — HEPATIC FUNCTION PANEL
ALT: 35 U/L (ref 0–44)
AST: 45 U/L — ABNORMAL HIGH (ref 15–41)
Albumin: 3.9 g/dL (ref 3.5–5.0)
Alkaline Phosphatase: 68 U/L (ref 38–126)
Bilirubin, Direct: <0.1 mg/dL (ref 0.0–0.2)
Total Bilirubin: 0.7 mg/dL (ref 0.3–1.2)
Total Protein: 6.7 g/dL (ref 6.5–8.1)

## 2019-03-24 LAB — ACETAMINOPHEN LEVEL: Acetaminophen (Tylenol), Serum: <10 ug/mL — ABNORMAL LOW (ref 10–30)

## 2019-03-24 LAB — TROPONIN I (HIGH SENSITIVITY): Troponin I (High Sensitivity): 74 ng/L — ABNORMAL HIGH (ref <18)

## 2019-03-24 MED ORDER — DEXTROSE-NACL 5-0.9 % IV SOLN: INTRAVENOUS | Status: DC

## 2019-03-24 MED ORDER — ONDANSETRON HCL 4 MG PO TABS: 4.0000 mg | ORAL_TABLET | Freq: Four times a day (QID) | ORAL | Status: DC | PRN

## 2019-03-24 MED ORDER — ACETAMINOPHEN 325 MG PO TABS
650.0000 mg | ORAL_TABLET | Freq: Four times a day (QID) | ORAL | Status: DC | PRN
  Administered 2019-03-26 – 2019-03-27 (×2): 650 mg via ORAL
  Filled 2019-03-24 (×2): qty 2

## 2019-03-24 MED ORDER — NALOXONE HCL 2 MG/2ML IJ SOSY
0.4000 mg | PREFILLED_SYRINGE | INTRAMUSCULAR | Status: DC | PRN
  Filled 2019-03-24: qty 2

## 2019-03-24 MED ORDER — ACETAMINOPHEN 650 MG RE SUPP: 650.0000 mg | Freq: Four times a day (QID) | RECTAL | Status: DC | PRN

## 2019-03-24 MED ORDER — SODIUM CHLORIDE 0.9 % IV BOLUS
1000.0000 mL | Freq: Once | INTRAVENOUS | Status: AC
  Administered 2019-03-24: 1000 mL via INTRAVENOUS

## 2019-03-24 MED ORDER — ENOXAPARIN SODIUM 40 MG/0.4ML ~~LOC~~ SOLN
40.0000 mg | SUBCUTANEOUS | Status: DC
  Filled 2019-03-24 (×2): qty 0.4

## 2019-03-24 MED ORDER — ONDANSETRON HCL 4 MG/2ML IJ SOLN: 4.0000 mg | Freq: Four times a day (QID) | INTRAMUSCULAR | Status: DC | PRN
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
## 2019-03-24 NOTE — Progress Notes (Signed)
Ch arrived to room in response to [facility] to ED. Ch waited outside the room while medical staff worked on overdosed Pt. After 20 minutes, RN came and let CH know that he will be kept in the ED, and will let Ch know if she will be needed.

## 2019-03-24 NOTE — ED Notes (Signed)
MD Siadecki at pt bedside
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
## 2019-03-24 NOTE — ED Notes (Signed)
Pt observed to be going into a deep sleep, speech becoming more slurred then before and pt seems a little less alert then previously. Charge RN [employer] notified and placed pt on co2 entitle. RN Erie Noe at pt bedside at this time

## 2019-03-24 NOTE — ED Notes (Signed)
Pt provided a urinal to urinate in

## 2019-03-24 NOTE — ED Notes (Signed)
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
## 2019-03-24 NOTE — ED Notes (Signed)
Pt unable to urinate, specimen not collected

## 2019-03-25 ENCOUNTER — Other Ambulatory Visit: Payer: Self-pay

## 2019-03-25 ENCOUNTER — Encounter: Payer: Self-pay | Admitting: Internal Medicine

## 2019-03-25 DIAGNOSIS — E872 Acidosis: Secondary | ICD-10-CM

## 2019-03-25 DIAGNOSIS — R778 Other specified abnormalities of plasma proteins: Secondary | ICD-10-CM

## 2019-03-25 LAB — BASIC METABOLIC PANEL
Anion gap: 6 (ref 5–15)
BUN: 19 mg/dL (ref 6–20)
CO2: 27 mmol/L (ref 22–32)
Calcium: 8.4 mg/dL — ABNORMAL LOW (ref 8.9–10.3)
Chloride: 106 mmol/L (ref 98–111)
Creatinine, Ser: 0.94 mg/dL (ref 0.61–1.24)
GFR calc Af Amer: >60 mL/min (ref >60)
GFR calc non Af Amer: >60 mL/min (ref >60)
Glucose, Bld: 100 mg/dL — ABNORMAL HIGH (ref 70–99)
Potassium: 4.5 mmol/L (ref 3.5–5.1)
Sodium: 139 mmol/L (ref 135–145)

## 2019-03-25 LAB — URINALYSIS, COMPLETE (UACMP) WITH MICROSCOPIC
Bacteria, UA: NONE SEEN
Bilirubin Urine: NEGATIVE
Glucose, UA: 50 mg/dL — AB
Hgb urine dipstick: NEGATIVE
Ketones, ur: NEGATIVE mg/dL
Nitrite: NEGATIVE
Protein, ur: 30 mg/dL — AB
Specific Gravity, Urine: 1.02 (ref 1.005–1.030)
Squamous Epithelial / LPF: NONE SEEN (ref 0–5)
pH: 6 (ref 5.0–8.0)

## 2019-03-25 LAB — RESPIRATORY PANEL BY RT PCR (FLU A&B, COVID)
Influenza A by PCR: NEGATIVE
Influenza B by PCR: NEGATIVE
SARS Coronavirus 2 by RT PCR: NEGATIVE

## 2019-03-25 LAB — URINE DRUG SCREEN, QUALITATIVE (ARMC ONLY)
Amphetamines, Ur Screen: POSITIVE — AB
Barbiturates, Ur Screen: NOT DETECTED
Benzodiazepine, Ur Scrn: NOT DETECTED
Cannabinoid 50 Ng, Ur ~~LOC~~: POSITIVE — AB
Cocaine Metabolite,Ur ~~LOC~~: POSITIVE — AB
MDMA (Ecstasy)Ur Screen: NOT DETECTED
Methadone Scn, Ur: NOT DETECTED
Opiate, Ur Screen: NOT DETECTED
Phencyclidine (PCP) Ur S: NOT DETECTED
Tricyclic, Ur Screen: NOT DETECTED

## 2019-03-25 LAB — CBC
HCT: 40.6 % (ref 39.0–52.0)
Hemoglobin: 13.3 g/dL (ref 13.0–17.0)
MCH: 29 pg (ref 26.0–34.0)
MCHC: 32.8 g/dL (ref 30.0–36.0)
MCV: 88.6 fL (ref 80.0–100.0)
Platelets: 278 10*3/uL (ref 150–400)
RBC: 4.58 MIL/uL (ref 4.22–5.81)
RDW: 12.5 % (ref 11.5–15.5)
WBC: 8.5 10*3/uL (ref 4.0–10.5)
nRBC: 0 % (ref 0.0–0.2)

## 2019-03-25 LAB — TROPONIN I (HIGH SENSITIVITY): Troponin I (High Sensitivity): 163 ng/L (ref <18)

## 2019-03-25 MED ORDER — NALOXONE HCL 2 MG/2ML IJ SOSY
PREFILLED_SYRINGE | INTRAMUSCULAR | Status: AC
  Administered 2019-03-25: 2 mg via INTRAVENOUS
  Filled 2019-03-25: qty 2

## 2019-03-25 MED ORDER — NALOXONE HCL 2 MG/2ML IJ SOSY: 2.0000 mg | PREFILLED_SYRINGE | Freq: Once | INTRAMUSCULAR | Status: AC

## 2019-03-25 NOTE — Progress Notes (Addendum)
Chaplain visited with patient. Patient was sitting up in bed and said he wanted a ginger ale. Chaplain asked nurse if patient could have ginger ale and she said yes. Chaplain went to the supply room and brought back a ginger ale and poured it in patient's cup. While the conversation was going on, patient started praying. Patient said without God we have nothing. Patient and Chaplain engaged in a long conversation about what he needs to do to stay clean. Patient said he needs to change the people he hangs with, be confident, make better choices, and keep busy. Chaplain commented on his tattoo, JULIE. Patient said Raynelle Fanning is his 27 year old daughter, who is his life. Patient said he lives with daughter and his grandmother. Patient also told Chaplain that he would probably be discharged to [facility]. Patient was polite and respectful saying Ma'am and thank you during the entire conversation. Chaplain commented on his manners and patient said my father taught [place] to be respectful. Chaplain encouraged patient to work on being go and respectful to himself. She also encouraged him to be discipline and to stay focus on the things that will help keep him clean. Chaplain explained that she will follow up on him. Chaplain offered pastoral presence and empathy.

## 2019-03-25 NOTE — ED Notes (Signed)
Pt easily aroused with touch.

## 2019-03-25 NOTE — ED Notes (Signed)
Pt currently A&Ox4, sitting upright in bed eating meal tray and speaking with chaplain. Pt has not required narcan since 0035 today. Attending MD Sherryll Burger notified to request change in level of care for admission.

## 2019-03-25 NOTE — ED Notes (Signed)
Pt sleeping with O2 decreasing. O2 up to 4L. MD called.

## 2019-03-25 NOTE — Progress Notes (Signed)
Michael Calderon NAME: Michael Calderon    MR#:  [id number]  DATE OF BIRTH:  August 12, 1990  SUBJECTIVE:  CHIEF COMPLAINT:   Chief Complaint  Patient presents with  . Drug Overdose  Sleepy REVIEW OF SYSTEMS:  ROS unable to obtain due to his mental status DRUG ALLERGIES:   Allergies  Allergen Reactions  . Sulfa Antibiotics Hives   VITALS:  Blood pressure 138/84, pulse 84, temperature 98 F (36.7 C), temperature source Oral, resp. rate 16, height 5\' 7"  (1.702 m), weight 74.8 kg, SpO2 100 %. PHYSICAL EXAMINATION:  Physical Exam  29 year old male lying in the bed, not in any acute distress. Eyes: PERLA, EOMI, irises appear normal, anicteric sclera,  ENMT: external ears and nose appear normal            Lips appears normal, oropharynx mucosa, tongue, posterior pharynx appear normal  Neck: neck appears normal, no masses, normal ROM, no thyromegaly, no JVD  CVS: S1-S2 clear, no murmur rubs or gallops,  , no carotid bruits, pedal pulses palpable, No LE edema Respiratory:  clear to auscultation bilaterally, no wheezing, rales or rhonchi. Respiratory effort normal. No accessory muscle use.  Abdomen: soft nontender, nondistended, normal bowel sounds, no hepatosplenomegaly, no hernias Musculoskeletal: : no cyanosis, clubbing ,  Neuro: Cranial nerves II-XII intact, sensation, reflexes normal, strength.  Very sleepy, opens eyes to deep painful stimuli/loud verbal stimuli Psych:stable mood and affect,  Skin: no rashes or lesions or ulcers, no induration or nodules LABORATORY PANEL:  Male CBC Recent Labs  Lab 03/25/19 0548  WBC 8.5  HGB 13.3  HCT 40.6  PLT 278   ------------------------------------------------------------------------------------------------------------------ Chemistries  Recent Labs  Lab 03/24/19 2136 03/24/19 2136 03/25/19 0548  NA 140   < > 139  K 4.2   < > 4.5  CL 100   < > 106  CO2 28   < > 27  GLUCOSE 241*   < > 100*    BUN 23*   < > 19  CREATININE 1.30*   < > 0.94  CALCIUM 8.5*   < > 8.4*  AST 45*  --   --   ALT 35  --   --   ALKPHOS 68  --   --   BILITOT 0.7  --   --    < > = values in this interval not displayed.   RADIOLOGY:  DG Chest Portable 1 View  Result Date: 03/24/2019 CLINICAL DATA:  Heroin overdose EXAM: PORTABLE CHEST 1 VIEW COMPARISON:  11/07/2014 FINDINGS: The heart size and mediastinal contours are within normal limits. Both lungs are clear. The visualized skeletal structures are unremarkable. IMPRESSION: No active disease. Electronically Signed   By: Donavan Foil M.D.   On: 03/24/2019 22:20   ASSESSMENT AND PLAN:  Michael Calderon is a 29 y.o. male with medical history significant for heroin use admitted after found unresponsive while using heroin with friends at home.  Fire department performed 3 minutes CPR prior to arrival of EMS who reported that patient was in asystole.  He became responsive however after 8 mg Narcan.  ED Course: By arrival in the emergency room patient was awake alert and oriented x4.  He arrived on O2 via nonrebreather which was weaned to 2 L.  He was afebrile, BP 149/103, pulse 100, RR 18, O2 sat 100% on 4 L.  Acetaminophen level less than 10, alcohol level less than 10.  Blood work mostly unremarkable though noted to have elevated blood sugar of 241.  Troponin 74, lactic acid 2.6.  Chest x-ray showed no acute disease.  Due to reported asystole, admission requested for monitoring.  Accidental heroin overdose, initial encounter [facility]) Respiratory arrest with possible cardiac arrest with successful resuscitation [facility]) -Patient was reportedly in asystole on arrival of EMS though responded to 8 mg Narcan.  Previously had 3 minutes of CPR prior to arrival of EMS -Monitor -Narcan as needed, no need thus far -Substance abuse counseling when improved    Lactic acidosis -Sepsis unlikely.  Likely related to tissue hypoxia from respiratory arrest     Elevated  troponin -Troponin elevated at 74, likely related to CPR activity -Continue to cycle.       DVT prophylaxis: Lovenox Family Communication: None today Disposition Plan: Back to home environment Barriers to DC -none  All the records are reviewed and case discussed with Care Management/Social Worker. Management plans discussed with the patient, nursing and they are in agreement.  CODE STATUS: Full Code  TOTAL TIME TAKING CARE OF THIS PATIENT: 15 minutes.   More than 50% of the time was spent in counseling/coordination of care: YES  POSSIBLE D/C IN 1-2 DAYS, DEPENDING ON CLINICAL CONDITION.   Delfino Lovett M.D on 03/25/2019 at 5:35 PM  [facility]   CC: Primary care physician; Patient, No Pcp Per  Note: This dictation was prepared with Dragon dictation along with smaller phrase technology. Any transcriptional errors that result from this process are unintentional.

## 2019-03-25 NOTE — Progress Notes (Signed)
Spoke with patients mother updating her on patients transfer to the floor to room 120. Mother states concern about why patient is on this floor and not on behavioral health. States patient commented about harming/killing the family and himself to her. MD made aware of mothers concerns and comments.

## 2019-03-25 NOTE — ED Provider Notes (Signed)
Patient awaiting admission. Unintentional heroin OD with 8mg  of narcan prior to arrival to the ED. I was called in the room as patient was having some episodes of apnea which resolved with waking him up. However since patient seems to be more somnolent, 2mg  of IV narcan was administered. Patient's respiratory status and mental status improved immediately. Will continue to monitor closely for need of narcan gtt. Patient with bedside sitter on continuous pulse ox and end-tidal CO2.    , , MD 03/25/19 [phone]

## 2019-03-26 DIAGNOSIS — T401X1A Poisoning by heroin, accidental (unintentional), initial encounter: Principal | ICD-10-CM

## 2019-03-26 LAB — CBC
HCT: 40.3 % (ref 39.0–52.0)
Hemoglobin: 13.2 g/dL (ref 13.0–17.0)
MCH: 29.7 pg (ref 26.0–34.0)
MCHC: 32.8 g/dL (ref 30.0–36.0)
MCV: 90.6 fL (ref 80.0–100.0)
Platelets: 253 10*3/uL (ref 150–400)
RBC: 4.45 MIL/uL (ref 4.22–5.81)
RDW: 12.5 % (ref 11.5–15.5)
WBC: 6.9 10*3/uL (ref 4.0–10.5)
nRBC: 0 % (ref 0.0–0.2)

## 2019-03-26 LAB — BASIC METABOLIC PANEL
Anion gap: 5 (ref 5–15)
BUN: 15 mg/dL (ref 6–20)
CO2: 27 mmol/L (ref 22–32)
Calcium: 8.8 mg/dL — ABNORMAL LOW (ref 8.9–10.3)
Chloride: 107 mmol/L (ref 98–111)
Creatinine, Ser: 0.83 mg/dL (ref 0.61–1.24)
GFR calc Af Amer: >60 mL/min (ref >60)
GFR calc non Af Amer: >60 mL/min (ref >60)
Glucose, Bld: 90 mg/dL (ref 70–99)
Potassium: 4.9 mmol/L (ref 3.5–5.1)
Sodium: 139 mmol/L (ref 135–145)

## 2019-03-26 NOTE — Progress Notes (Signed)
   03/26/19 2000  Clinical Encounter Type  Visited With Patient and family together  Visit Type Follow-up  Referral From Chaplain  Consult/Referral To Chaplain  Chaplain visited with patient at the request of another chaplain. Patient admitted to having a drug addiction and needed help. Patient stated that he needed to do this for his family. Chaplain advised patient to do it for himself. Mother stated that she was having a difficult time trying to get patient into placement. Chaplain gave several suggestions. Also spoke with the hospitalitis about getting the patient admitted in the [facility].

## 2019-03-26 NOTE — Progress Notes (Signed)
Pt pulled out his IV. MD notified. Orders to D/c IV fluids and okay to leave IV out.

## 2019-03-26 NOTE — Progress Notes (Signed)
1        [place] at [facility]   PATIENT NAME: Michael Calderon    MR#:  [id number]  DATE OF BIRTH:  [birth year]  SUBJECTIVE:  CHIEF COMPLAINT:   Chief Complaint  Patient presents with  . Drug Overdose  Awake, falls back to sleep easily REVIEW OF SYSTEMS:  Review of Systems  Constitutional: Negative for diaphoresis, fever, malaise/fatigue and weight loss.  HENT: Negative for ear discharge, ear pain, hearing loss, nosebleeds, sore throat and tinnitus.   Eyes: Negative for blurred vision and pain.  Respiratory: Negative for cough, hemoptysis, shortness of breath and wheezing.   Cardiovascular: Negative for chest pain, palpitations, orthopnea and leg swelling.  Gastrointestinal: Negative for abdominal pain, blood in stool, constipation, diarrhea, heartburn, nausea and vomiting.  Genitourinary: Negative for dysuria, frequency and urgency.  Musculoskeletal: Negative for back pain and myalgias.  Skin: Negative for itching and rash.  Neurological: Negative for dizziness, tingling, tremors, focal weakness, seizures, weakness and headaches.  Psychiatric/Behavioral: Negative for depression. The patient is not nervous/anxious.     DRUG ALLERGIES:   Allergies  Allergen Reactions  . Sulfa Antibiotics Hives   VITALS:  Blood pressure 133/80, pulse 68, temperature 97.6 F (36.4 C), resp. rate 17, height 5\' 7"  (1.702 m), weight 74.8 kg, SpO2 100 %. PHYSICAL EXAMINATION:  Physical Exam  29 year old male lying in the bed, not in any acute distress. Eyes: PERLA, EOMI, irises appear normal, anicteric sclera,  ENMT: external ears and nose appear normal            Lips appears normal, oropharynx mucosa, tongue, posterior pharynx appear normal  Neck: neck appears normal, no masses, normal ROM, no thyromegaly, no JVD  CVS: S1-S2 clear, no murmur rubs or gallops,  , no carotid bruits, pedal pulses palpable, No LE edema Respiratory:  clear to auscultation bilaterally, no wheezing, rales  or rhonchi. Respiratory effort normal. No accessory muscle use.  Abdomen: soft nontender, nondistended, normal bowel sounds, no hepatosplenomegaly, no hernias Musculoskeletal: : no cyanosis, clubbing ,  Neuro: Cranial nerves II-XII intact, sensation, reflexes normal, strength.  Awake but falls back to sleep easily Psych:stable mood and affect Skin: no rashes or lesions or ulcers, no induration or nodules LABORATORY PANEL:  Male CBC Recent Labs  Lab 03/26/19 0749  WBC 6.9  HGB 13.2  HCT 40.3  PLT 253   ------------------------------------------------------------------------------------------------------------------ Chemistries  Recent Labs  Lab 03/24/19 2136 03/25/19 0548 03/26/19 0749  NA 140   < > 139  K 4.2   < > 4.9  CL 100   < > 107  CO2 28   < > 27  GLUCOSE 241*   < > 90  BUN 23*   < > 15  CREATININE 1.30*   < > 0.83  CALCIUM 8.5*   < > 8.8*  AST 45*  --   --   ALT 35  --   --   ALKPHOS 68  --   --   BILITOT 0.7  --   --    < > = values in this interval not displayed.   RADIOLOGY:  No results found. ASSESSMENT AND PLAN:  Michael Calderon is a 29 y.o. male with medical history significant for heroin use admitted after found unresponsive while using heroin with friends at home.  Fire department performed 3 minutes CPR prior to arrival of EMS who reported that patient was in asystole.  He became responsive however after 8 mg Narcan.  Accidental heroin overdose  Respiratory arrest with possible cardiac arrest with successful resuscitation [facility]) -Patient was reportedly in asystole on arrival of EMS though responded to 8 mg Narcan.  Previously had 3 minutes of CPR prior to arrival of EMS -No further need of Narcan since last used by EMS -Substance abuse counseling when improved -Requested psychiatry consult per family as they are concerned about safety and mother is requesting him to transfer to behavioral medicine per nursing    Lactic acidosis - Present on  admission - Likely related to tissue hypoxia from respiratory arrest -Sepsis ruled out.       Elevated troponin -Troponin elevated at 74, due to CPR activity       DVT prophylaxis: Lovenox Family Communication: None today.  Talked with patient Disposition Plan: Back to home environment or behavioral medicine depending on psychiatry evaluation Barriers to DC -none, patient still sleepy. behavioral medicine evaluation  All the records are reviewed and case discussed with Care Management/Social Worker. Management plans discussed with the patient, nursing and they are in agreement.  CODE STATUS: Full Code  TOTAL TIME TAKING CARE OF THIS PATIENT: 15 minutes.   More than 50% of the time was spent in counseling/coordination of care: YES  POSSIBLE D/C IN 1-2 DAYS, DEPENDING ON CLINICAL CONDITION.   Max Sane M.D on 03/26/2019 at 10:16 AM  [facility]   CC: Primary care physician; Patient, No Pcp Per  Note: This dictation was prepared with Dragon dictation along with smaller phrase technology. Any transcriptional errors that result from this process are unintentional.
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
## 2019-03-27 DIAGNOSIS — R092 Respiratory arrest: Secondary | ICD-10-CM

## 2019-03-27 NOTE — Progress Notes (Signed)
Pt was not present in room or on unit. This RN called the phone number on the chart for the patient. The patient answered and once I said "Amman, this is your nurse. Where are you?" the patient hung up the phone and has now turned off his phone. Pt did not receive discharge paperwork. Pt did not have an IV present. MD notified of pt leaving.

## 2019-03-27 NOTE — Discharge Instructions (Signed)
Accidental Drug Poisoning, Adult Accidental drug poisoning happens when a person accidentally takes too much of a substance, such as a prescription medicine, an over-the-counter medicine, a vitamin, a supplement, or an illegal drug. The effects of drug poisoning can be mild, dangerous, or even deadly. What are the causes? This condition is caused by taking too much of a medicine, illegal drug, or other substance. It often results from:  Lack of knowledge about a substance.  Using more than one substance at the same time.  An error made by the health care provider who prescribed the substance.  An error made by the pharmacist who filled the prescription.  A lapse in memory, such as forgetting that you have already taken a dose of the medicine.  Suddenly using a substance after a long period of not using it. The following substances and medicines are more likely to cause an accidental drug poisoning:  Medicines that treat mental problems (psychotropic medicines).  Pain medicines.  Cocaine.  Heroin.  Multivitamins that contain iron.  Over-the-counter cold and cough medicines. What increases the risk? This condition is more likely to occur in:  Elderly adults. Elderly adults are at risk because they may: ? Be taking many different medicines. ? Have difficulty reading labels. ? Forget when they last took their medicine.  People who use illegal drugs.  People who drink alcohol while using illegal drugs or certain medicines.  People with certain mental health conditions. What are the signs or symptoms? Symptoms of this condition depend on the substance and the amount that was taken. Common symptoms include:  Behavior changes, such as confusion.  Sleepiness.  Weakness.  Slowed breathing.  Nausea and vomiting.  Seizures.  Very large or small eye pupil size. A drug poisoning can cause a very serious condition in which your blood pressure drops to a low level (shock).  Symptoms of shock include:  Cold and clammy skin.  Pale skin.  Blue lips.  Very slow breathing.  Extreme sleepiness.  Severe confusion.  Dizziness or fainting. How is this diagnosed? This condition is diagnosed based on:  Your symptoms. You will be asked about the substances you took and when you took them.  A physical exam. You may also have other tests, including:  Urine tests.  Blood tests.  An electrocardiogram (ECG). How is this treated? This condition may need to be treated right away at the hospital. Treatment may involve:  Getting fluids and electrolytes through an IV.  Having a breathing tube inserted in your airway (endotracheal tube) to help you breathe.  Taking medicines. These may include medicines that: ? Absorb any substance that is in your digestive system. ? Block or reverse the effect of the substance that caused the drug poisoning.  Having your blood filtered through an artificial kidney machine (hemodialysis).  Ongoing counseling and mental health support. This may be provided if you used an illegal drug. Follow these instructions at home: Medicines   Take over-the-counter and prescription medicines only as told by your health care provider.  Before taking a new medicine, ask your health care provider whether the medicine: ? May cause side effects. ? Might react with other medicines.  Keep a list of all the medicines that you take, including over-the-counter medicines, vitamins, supplements, and herbs. Bring this list with you to all of your medical visits. General instructions   Drink enough fluid to keep your urine pale yellow.  If you are working with a counselor or mental health professional, make   sure to follow his or her instructions.  Do not drink alcohol if: ? Your health care provider tells you not to drink. ? You are pregnant, may be pregnant, or are planning to become pregnant.  If you drink alcohol, limit how much you  have: ? 0-1 drink a day for women. ? 0-2 drinks a day for men.  Be aware of how much alcohol is in your drink. In the [place] one drink equals one typical bottle of beer (12 oz), one-half glass of wine (5 oz), or one shot of hard liquor (1 oz).  Keep all follow-up visits as told by your health care provider. This is important. How is this prevented?   Get help if you are struggling with: ? Alcohol or drug use. ? Depression or another mental health problem.  Keep the phone number of your local poison control center near your phone or on your cell phone. The hotline of the American Association of Poison Control Centers is (800) 222-1222.  Store all medicines in safety containers that are out of the reach of children.  Read the drug inserts that come with your medicines.  Create a system for taking your medicine, such as a pillbox, that will help you avoid taking too much of the medicine.  Do not drink alcohol while taking medicines unless your health care provider approves.  Do not use illegal drugs.  Do not take medicines that are not prescribed for you. Contact a health care provider if:  Your symptoms return.  You develop new symptoms or side effects after taking a medicine.  You have questions about possible drug poisoning. Call your local poison control center at (800) 222-1222. Get help right away if:  You think that you or someone else may have taken too much of a substance.  You or someone else is having symptoms of drug poisoning. Summary  Accidental drug poisoning happens when a person accidentally takes too much of a substance, such as a prescription medicine, an over-the-counter medicine, a vitamin, a supplement, or an illegal drug.  The effects of drug poisoning can be mild, dangerous, or even deadly.  This condition is diagnosed based on your symptoms and a physical exam. You will be asked to tell your health care provider which substances you took and when you  took them.  This condition may need to be treated right away at the hospital. This information is not intended to replace advice given to you by your health care provider. Make sure you discuss any questions you have with your health care provider. Document Revised: 12/05/2016 Document Reviewed: 11/24/2016 Elsevier Patient Education  2020 [employer].  

## 2019-03-27 NOTE — Progress Notes (Signed)
Primary RN notified this RN that patient was not present in room. Patient was not seen on floor. This RN attempted to call patient. Call went directly to voicemail. Patient did not receive any discharge paperwork nor did patient sign AMA paperwork. Primary RN informed this RN that patient called primary RN and stated he would return to hospital to pickup discharge paperwork. Will follow up with primary RN.

## 2019-03-27 NOTE — Discharge Summary (Signed)
[facility] [place] NAME: Michael Calderon    MR#:  [id number]  DATE OF BIRTH:  1990/11/20  DATE OF ADMISSION:  03/24/2019   ADMITTING PHYSICIAN: Athena Masse, MD  DATE OF DISCHARGE: 03/27/2019 10:47 AM  PRIMARY CARE PHYSICIAN: Patient, No Pcp Per   ADMISSION DIAGNOSIS:  Respiratory arrest ([place]) [R09.2] Accidental overdose of heroin, initial encounter ([place]) [T40.1X1A] Accidental heroin overdose, initial encounter ([place]) [T40.1X1A] DISCHARGE DIAGNOSIS:  Active Problems:   Accidental overdose of heroin ([place])   Cardiac arrest with successful resuscitation ([place])   Lactic acidosis   Elevated troponin   Respiratory arrest ([place])  SECONDARY DIAGNOSIS:  History reviewed. No pertinent past medical history. HOSPITAL COURSE:  Michael Calderon a 29 y.o.malewith medical history significant forheroin use admitted after found unresponsive while using heroin with friends at home. Fire department performed 3 minutes CPR prior to arrival of EMS who reported that patient was in asystole. He became responsive however after 8 mg Narcan.  Accidental heroin overdose Respiratory arrest with possible cardiac arrest with successful resuscitation [facility]) -Patient was reportedly in asystole on arrival of EMS though responded to 8 mg Narcan. Previously had 3 minutes of CPR prior to arrival of EMS -No further need of Narcan since last used by EMS -Substance abuse counseling given and psychiatry also seen  Lactic acidosis - Present on admission - due to tissue hypoxia from respiratory arrest -Sepsis ruled out.    Elevated troponin -Troponin elevated at 74, due to CPR activity.  No MI  DISCHARGE CONDITIONS:  Stable CONSULTS OBTAINED:   DRUG ALLERGIES:   Allergies  Allergen Reactions  . Sulfa Antibiotics Hives   DISCHARGE MEDICATIONS:   Allergies as of 03/27/2019      Reactions   Sulfa Antibiotics Hives      Medication List    You have not  been prescribed any medications.    DISCHARGE INSTRUCTIONS:   DIET:  Regular diet DISCHARGE CONDITION:  Stable ACTIVITY:  Activity as tolerated OXYGEN:  Home Oxygen: No.  Oxygen Delivery: room air DISCHARGE LOCATION:  home   If you experience worsening of your admission symptoms, develop shortness of breath, life threatening emergency, suicidal or homicidal thoughts you must seek medical attention immediately by calling 911 or calling your MD immediately  if symptoms less severe.  You Must read complete instructions/literature along with all the possible adverse reactions/side effects for all the Medicines you take and that have been prescribed to you. Take any new Medicines after you have completely understood and accpet all the possible adverse reactions/side effects.   Please note  You were cared for by a hospitalist during your hospital stay. If you have any questions about your discharge medications or the care you received while you were in the hospital after you are discharged, you can call the unit and asked to speak with the hospitalist on call if the hospitalist that took care of you is not available. Once you are discharged, your primary care physician will handle any further medical issues. Please note that NO REFILLS for any discharge medications will be authorized once you are discharged, as it is imperative that you return to your primary care physician (or establish a relationship with a primary care physician if you do not have one) for your aftercare needs so that they can reassess your need for medications and monitor your lab values.    On the day of Discharge:  VITAL  SIGNS:  Blood pressure (!) 156/94, pulse 70, temperature 98.5 F (36.9 C), temperature source Oral, resp. rate 17, height 5\' 5"  (1.651 m), weight 93 kg, SpO2 100 %. PHYSICAL EXAMINATION:  GENERAL:  29 y.o.-year-old patient lying in the bed with no acute distress.  EYES: Pupils equal, round, reactive  to light and accommodation. No scleral icterus. Extraocular muscles intact.  HEENT: Head atraumatic, normocephalic. Oropharynx and nasopharynx clear.  NECK:  Supple, no jugular venous distention. No thyroid enlargement, no tenderness.  LUNGS: Normal breath sounds bilaterally, no wheezing, rales,rhonchi or crepitation. No use of accessory muscles of respiration.  CARDIOVASCULAR: S1, S2 normal. No murmurs, rubs, or gallops.  ABDOMEN: Soft, non-tender, non-distended. Bowel sounds present. No organomegaly or mass.  EXTREMITIES: No pedal edema, cyanosis, or clubbing.  NEUROLOGIC: Cranial nerves II through XII are intact. Muscle strength 5/5 in all extremities. Sensation intact. Gait not checked.  PSYCHIATRIC: The patient is alert and oriented x 3.  SKIN: No obvious rash, lesion, or ulcer.  DATA REVIEW:   CBC Recent Labs  Lab 03/26/19 0749  WBC 6.9  HGB 13.2  HCT 40.3  PLT 253    Chemistries  Recent Labs  Lab 03/24/19 2136 03/25/19 0548 03/26/19 0749  NA 140   < > 139  K 4.2   < > 4.9  CL 100   < > 107  CO2 28   < > 27  GLUCOSE 241*   < > 90  BUN 23*   < > 15  CREATININE 1.30*   < > 0.83  CALCIUM 8.5*   < > 8.8*  AST 45*  --   --   ALT 35  --   --   ALKPHOS 68  --   --   BILITOT 0.7  --   --    < > = values in this interval not displayed.     Outpatient follow-up    Management plans discussed with the patient, family and they are in agreement.  CODE STATUS: Prior   TOTAL TIME TAKING CARE OF THIS PATIENT: 45 minutes.    03/28/19 M.D on 03/27/2019 at 4:38 PM  [facility]   CC: Primary care physician; Patient, No Pcp Per   Note: This dictation was prepared with Dragon dictation along with smaller phrase technology. Any transcriptional errors that result from this process are unintentional.

## 2020-01-31 ENCOUNTER — Emergency Department
Admission: EM | Admit: 2020-01-31 | Discharge: 2020-01-31 | Disposition: A | Discharge status: Home / Self Care | Payer: Self-pay | Attending: Emergency Medicine | Admitting: Emergency Medicine

## 2020-01-31 ENCOUNTER — Other Ambulatory Visit: Payer: Self-pay

## 2020-01-31 DIAGNOSIS — R21 Rash and other nonspecific skin eruption: Secondary | ICD-10-CM | POA: Insufficient documentation

## 2020-01-31 DIAGNOSIS — R509 Fever, unspecified: Secondary | ICD-10-CM | POA: Diagnosis not present

## 2020-01-31 DIAGNOSIS — R432 Parageusia: Secondary | ICD-10-CM | POA: Insufficient documentation

## 2020-01-31 DIAGNOSIS — Z5321 Procedure and treatment not carried out due to patient leaving prior to being seen by health care provider: Secondary | ICD-10-CM | POA: Diagnosis not present

## 2020-01-31 NOTE — ED Triage Notes (Signed)
Pt comes via POV from home with c/o rash all over body for 2 days. Pt also states fever and loss of taste.

## 2020-05-08 ENCOUNTER — Other Ambulatory Visit: Payer: Self-pay

## 2020-05-08 ENCOUNTER — Emergency Department
Admission: EM | Admit: 2020-05-08 | Discharge: 2020-05-08 | Disposition: A | Discharge status: Home / Self Care | Payer: Self-pay | Attending: Emergency Medicine | Admitting: Emergency Medicine

## 2020-05-08 DIAGNOSIS — H53149 Visual discomfort, unspecified: Secondary | ICD-10-CM | POA: Insufficient documentation

## 2020-05-08 DIAGNOSIS — Z28310 Unvaccinated for covid-19: Secondary | ICD-10-CM | POA: Insufficient documentation

## 2020-05-08 DIAGNOSIS — Z20822 Contact with and (suspected) exposure to covid-19: Secondary | ICD-10-CM | POA: Insufficient documentation

## 2020-05-08 DIAGNOSIS — B349 Viral infection, unspecified: Secondary | ICD-10-CM

## 2020-05-08 DIAGNOSIS — F17200 Nicotine dependence, unspecified, uncomplicated: Secondary | ICD-10-CM | POA: Insufficient documentation

## 2020-05-08 DIAGNOSIS — J029 Acute pharyngitis, unspecified: Secondary | ICD-10-CM

## 2020-05-08 LAB — SARS CORONAVIRUS 2 (TAT 6-24 HRS): SARS Coronavirus 2: NEGATIVE

## 2020-05-08 LAB — GROUP A STREP BY PCR: Group A Strep by PCR: NOT DETECTED

## 2020-05-08 MED ORDER — KETOROLAC TROMETHAMINE 60 MG/2ML IM SOLN
30.0000 mg | Freq: Once | INTRAMUSCULAR | Status: AC
  Administered 2020-05-08: 30 mg via INTRAMUSCULAR
  Filled 2020-05-08: qty 2

## 2020-05-08 MED ORDER — DEXAMETHASONE 1 MG/ML PO CONC
10.0000 mg | Freq: Once | ORAL | Status: AC
  Administered 2020-05-08: 10 mg via ORAL
  Filled 2020-05-08: qty 10

## 2020-05-08 MED ORDER — MAGIC MOUTHWASH: 5.0000 mL | Freq: Three times a day (TID) | ORAL | 0 refills | Status: AC | PRN

## 2020-05-08 NOTE — ED Triage Notes (Signed)
Pt to ED via EMS from home, pt states his head has been throbbing for the past week with no relief. Pt denies nasal congestion. Pt states he had fever of 101 yesterday. Pt states bright light causes pain to inc

## 2020-05-08 NOTE — ED Provider Notes (Signed)
[facility] Emergency Department Provider Note   ____________________________________________   Event Date/Time   First MD Initiated Contact with Patient 05/08/20 [phone]     (approximate)  I have reviewed the triage vital signs and the nursing notes.   HISTORY  Chief Complaint Headache    HPI Michael Calderon is a 30 y.o. male brought to the ED via EMS from home with a chief complaint of headache, nasal congestion, sore throat, fever, body aches.  Symptoms x1 week.  Complains of left-sided headache, sinus congestion associated with photophobia.  Denies associated cough, neck pain, chest pain, shortness of breath, abdominal pain, nausea, vomiting or dizziness.  Patient is not vaccinated against COVID-19.     Past medical history Accidental heroin overdose Cardiorespiratory arrest  Patient Active Problem List   Diagnosis Date Noted  . Respiratory arrest ([facility])   . Accidental overdose of heroin ([facility]) 03/24/2019  . Cardiac arrest with successful resuscitation ([facility]) 03/24/2019  . Lactic acidosis 03/24/2019  . Elevated troponin 03/24/2019    Past Surgical History:  Procedure Laterality Date  . CHEST TUBE INSERTION    . TONSILLECTOMY    . TYMPANOPLASTY      Prior to Admission medications   Medication Sig Start Date End Date Taking? Authorizing Provider  magic mouthwash SOLN Take 5 mLs by mouth 3 (three) times daily as needed for mouth pain. 05/08/20  Yes Irean Hong, MD    Allergies Sulfa antibiotics  No family history on file.  Social History Social History   Tobacco Use  . Smoking status: Current Every Day Smoker  . Smokeless tobacco: Current User    Types: Chew  Substance Use Topics  . Alcohol use: Yes  . Drug use: Yes    Types: Marijuana, Methamphetamines, IV, Cocaine    Review of Systems  Constitutional: Positive for fever, body aches. Eyes: No visual changes. ENT: Positive for sinus congestion, sore throat. Cardiovascular:  Denies chest pain. Respiratory: Denies shortness of breath. Gastrointestinal: No abdominal pain.  No nausea, no vomiting.  No diarrhea.  No constipation. Genitourinary: Negative for dysuria. Musculoskeletal: Negative for back pain. Skin: Negative for rash. Neurological: Positive for headache.negative for focal weakness or numbness.   ____________________________________________   PHYSICAL EXAM:  VITAL SIGNS: ED Triage Vitals  Enc Vitals Group     BP 05/08/20 0341 (!) 162/110     Pulse Rate 05/08/20 0341 69     Resp 05/08/20 0341 16     Temp 05/08/20 0341 97.9 F (36.6 C)     Temp Source 05/08/20 0341 Oral     SpO2 05/08/20 0341 98 %     Weight 05/08/20 0340 175 lb (79.4 kg)     Height 05/08/20 0340 5\' 7"  (1.702 m)     Head Circumference --      Peak Flow --      Pain Score 05/08/20 0339 10     Pain Loc --      Pain Edu? --      Excl. in GC? --     Constitutional: Alert and oriented. Well appearing and in no acute distress. Eyes: Conjunctivae are normal. PERRL. EOMI. Head: Atraumatic. Nose: No congestion/rhinnorhea. Mouth/Throat: Mucous membranes are moist.  Oropharynx mildly erythematous with mild bilateral and symmetrical tonsillar swelling without exudates or peritonsillar abscess.  There is no hoarse muffled voice.  There is no drooling. Neck: No stridor.  Supple neck without meningismus.  No carotid bruits Hematological/Lymphatic/Immunilogical: No cervical lymphadenopathy. Cardiovascular: Normal rate,  regular rhythm. Grossly normal heart sounds.  Good peripheral circulation. Respiratory: Normal respiratory effort.  No retractions. Lungs CTAB. Gastrointestinal: Soft and nontender to light or deep palpation. No distention. No abdominal bruits. No CVA tenderness. Musculoskeletal: No lower extremity tenderness nor edema.  No joint effusions. Neurologic:  Normal speech and language. No gross focal neurologic deficits are appreciated. No gait instability. Skin:  Skin is  warm, dry and intact. No rash noted.  No petechiae. Psychiatric: Mood and affect are normal. Speech and behavior are normal.  ____________________________________________   LABS (all labs ordered are listed, but only abnormal results are displayed)  Labs Reviewed  GROUP A STREP BY PCR  SARS CORONAVIRUS 2 (TAT 6-24 HRS)   ____________________________________________  EKG  None ____________________________________________  RADIOLOGY I, Maykel Reitter J, personally viewed and evaluated these images (plain radiographs) as part of my medical decision making, as well as reviewing the written report by the radiologist.  ED MD interpretation: None  Official radiology report(s): No results found.  ____________________________________________   PROCEDURES  Procedure(s) performed (including Critical Care):  Procedures   ____________________________________________   INITIAL IMPRESSION / ASSESSMENT AND PLAN / ED COURSE  As part of my medical decision making, I reviewed the following data within the electronic MEDICAL RECORD NUMBER Nursing notes reviewed and incorporated, Labs reviewed, Old chart reviewed and Notes from prior ED visits     30 year old male presenting with a 1 week history of fever, congestion, sore throat, headache, body aches.  Will obtain rapid strep, COVID-19 swab, administer Decadron, IM Toradol and reassess  Clinical Course as of 05/08/20 0545  Tue May 08, 2020  0533 Awaken patient from sleep to update him on negative rapid strep result.  Headache improved.  Quarantine precautions given until COVID-19 swab results.  Strict return precautions given.  Patient verbalizes understanding and agrees with plan of care. [JS]    Clinical Course User Index [JS] Irean Hong, MD     ____________________________________________   FINAL CLINICAL IMPRESSION(S) / ED DIAGNOSES  Final diagnoses:  Viral illness  Sore throat     ED Discharge Orders         Ordered     magic mouthwash SOLN  3 times daily PRN        05/08/20 0535          *Please note:  Sylvie Farrier was evaluated in Emergency Department on 05/08/2020 for the symptoms described in the history of present illness. He was evaluated in the context of the global COVID-19 pandemic, which necessitated consideration that the patient might be at risk for infection with the SARS-CoV-2 virus that causes COVID-19. Institutional protocols and algorithms that pertain to the evaluation of patients at risk for COVID-19 are in a state of rapid change based on information released by regulatory bodies including the CDC and federal and state organizations. These policies and algorithms were followed during the patient's care in the ED.  Some ED evaluations and interventions may be delayed as a result of limited staffing during and the pandemic.*   Note:  This document was prepared using Dragon voice recognition software and may include unintentional dictation errors.   Irean Hong, MD 05/08/20 [id number]

## 2020-05-08 NOTE — Discharge Instructions (Signed)
1.  You may take Tylenol and/or Ibuprofen as needed for body aches. 2.  You may take Magic mouthwash as needed for throat discomfort. 3.  You may check MyChart for COVID-19 results. 4.  Return to the ER for worsening symptoms, persistent vomiting, difficulty breathing or other concerns.

## 2020-06-20 ENCOUNTER — Emergency Department: Admission: EM | Admit: 2020-06-20 | Discharge: 2020-06-20 | Discharge status: Against Medical Advice | Payer: Self-pay

## 2021-06-13 ENCOUNTER — Ambulatory Visit: Payer: Self-pay

## 2021-06-13 MED ORDER — PENICILLIN G BENZATHINE 1200000 UNIT/2ML IM SUSY
2.4000 10*6.[IU] | PREFILLED_SYRINGE | Freq: Once | INTRAMUSCULAR | Status: AC
  Administered 2021-06-13: 2.4 10*6.[IU] via INTRAMUSCULAR

## 2021-06-13 NOTE — Progress Notes (Signed)
2.28mu Bicillin given IM x1 dose per order from Glenna Fellows, FNP .Tolerated well. Tx administered at [facility].

## 2021-08-06 ENCOUNTER — Ambulatory Visit: Payer: Self-pay

## 2021-09-28 IMAGING — DX DG CHEST 1V PORT
1 series · 1 of 1 positions shown · non-contrast
Comparison: 11/07/2014

CLINICAL DATA: Heroin overdose

EXAM:
PORTABLE CHEST 1 VIEW

[chest ap]
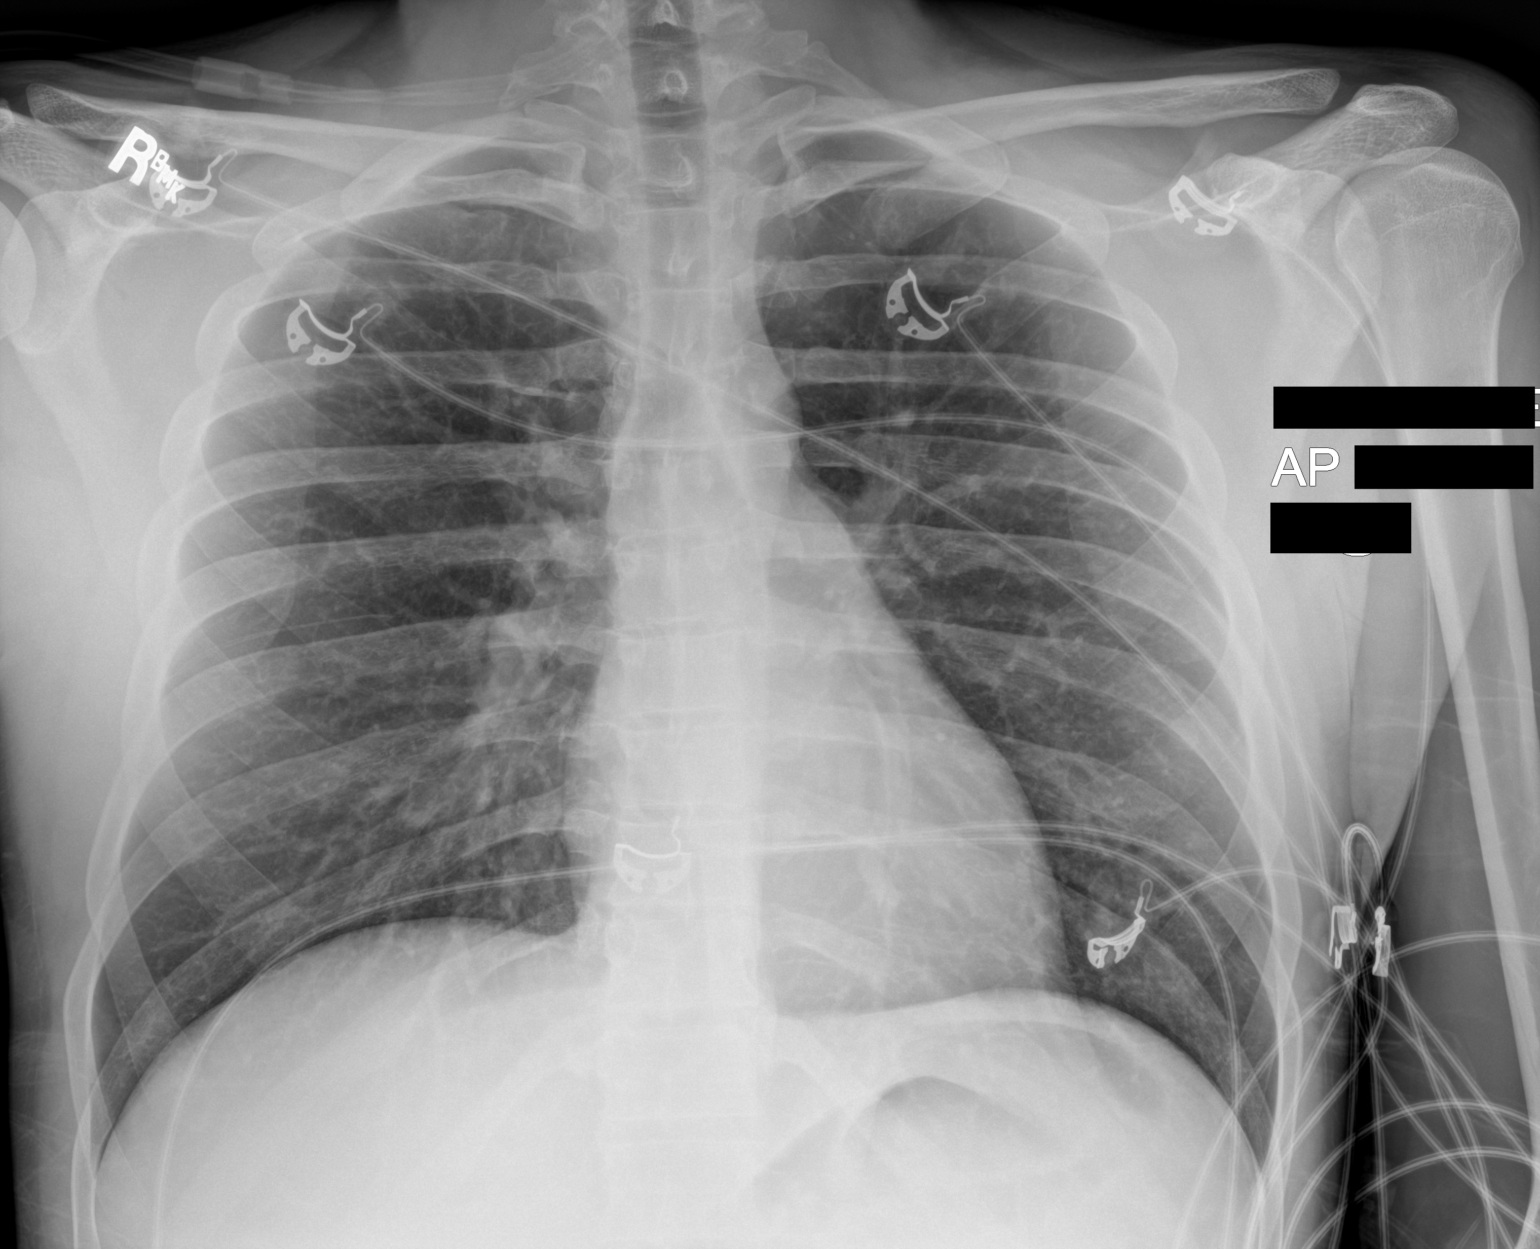

[1 of 1 positions shown; findings below may reference images not displayed]

FINDINGS: The heart size and mediastinal contours are within normal limits.
Both lungs are clear. The visualized skeletal structures are
unremarkable.
IMPRESSION: No active disease.

## 2022-01-18 ENCOUNTER — Other Ambulatory Visit: Payer: Self-pay

## 2022-01-18 ENCOUNTER — Emergency Department
Admission: EM | Admit: 2022-01-18 | Discharge: 2022-01-20 | Disposition: A | Discharge status: Home / Self Care | Payer: Self-pay | Attending: Emergency Medicine | Admitting: Emergency Medicine

## 2022-01-18 DIAGNOSIS — R45851 Suicidal ideations: Secondary | ICD-10-CM

## 2022-01-18 DIAGNOSIS — I469 Cardiac arrest, cause unspecified: Secondary | ICD-10-CM

## 2022-01-18 DIAGNOSIS — F1910 Other psychoactive substance abuse, uncomplicated: Secondary | ICD-10-CM

## 2022-01-18 DIAGNOSIS — F23 Brief psychotic disorder: Secondary | ICD-10-CM

## 2022-01-18 DIAGNOSIS — F15259 Other stimulant dependence with stimulant-induced psychotic disorder, unspecified: Secondary | ICD-10-CM | POA: Diagnosis present

## 2022-01-18 DIAGNOSIS — F449 Dissociative and conversion disorder, unspecified: Secondary | ICD-10-CM | POA: Insufficient documentation

## 2022-01-18 DIAGNOSIS — T401X1D Poisoning by heroin, accidental (unintentional), subsequent encounter: Secondary | ICD-10-CM

## 2022-01-18 DIAGNOSIS — F1510 Other stimulant abuse, uncomplicated: Secondary | ICD-10-CM

## 2022-01-18 DIAGNOSIS — F29 Unspecified psychosis not due to a substance or known physiological condition: Secondary | ICD-10-CM

## 2022-01-18 DIAGNOSIS — Z1152 Encounter for screening for COVID-19: Secondary | ICD-10-CM | POA: Insufficient documentation

## 2022-01-18 DIAGNOSIS — R440 Auditory hallucinations: Secondary | ICD-10-CM | POA: Insufficient documentation

## 2022-01-18 LAB — URINE DRUG SCREEN, QUALITATIVE (ARMC ONLY)
Amphetamines, Ur Screen: POSITIVE — AB
Barbiturates, Ur Screen: NOT DETECTED
Benzodiazepine, Ur Scrn: NOT DETECTED
Cannabinoid 50 Ng, Ur ~~LOC~~: NOT DETECTED
Cocaine Metabolite,Ur ~~LOC~~: NOT DETECTED
MDMA (Ecstasy)Ur Screen: NOT DETECTED
Methadone Scn, Ur: NOT DETECTED
Opiate, Ur Screen: NOT DETECTED
Phencyclidine (PCP) Ur S: NOT DETECTED
Tricyclic, Ur Screen: NOT DETECTED

## 2022-01-18 LAB — CBC
HCT: 46.9 % (ref 39.0–52.0)
Hemoglobin: 15.3 g/dL (ref 13.0–17.0)
MCH: 29.3 pg (ref 26.0–34.0)
MCHC: 32.6 g/dL (ref 30.0–36.0)
MCV: 89.8 fL (ref 80.0–100.0)
Platelets: 338 10*3/uL (ref 150–400)
RBC: 5.22 MIL/uL (ref 4.22–5.81)
RDW: 12.8 % (ref 11.5–15.5)
WBC: 10 10*3/uL (ref 4.0–10.5)
nRBC: 0 % (ref 0.0–0.2)

## 2022-01-18 LAB — COMPREHENSIVE METABOLIC PANEL
ALT: 16 U/L (ref 0–44)
AST: 19 U/L (ref 15–41)
Albumin: 3.8 g/dL (ref 3.5–5.0)
Alkaline Phosphatase: 47 U/L (ref 38–126)
Anion gap: 7 (ref 5–15)
BUN: 13 mg/dL (ref 6–20)
CO2: 28 mmol/L (ref 22–32)
Calcium: 8.7 mg/dL — ABNORMAL LOW (ref 8.9–10.3)
Chloride: 103 mmol/L (ref 98–111)
Creatinine, Ser: 1.08 mg/dL (ref 0.61–1.24)
GFR, Estimated: >60 mL/min (ref >60)
Glucose, Bld: 85 mg/dL (ref 70–99)
Potassium: 4.3 mmol/L (ref 3.5–5.1)
Sodium: 138 mmol/L (ref 135–145)
Total Bilirubin: 0.6 mg/dL (ref 0.3–1.2)
Total Protein: 6.7 g/dL (ref 6.5–8.1)

## 2022-01-18 LAB — RESP PANEL BY RT-PCR (RSV, FLU A&B, COVID)  RVPGX2
Influenza A by PCR: NEGATIVE
Influenza B by PCR: NEGATIVE
Resp Syncytial Virus by PCR: NEGATIVE
SARS Coronavirus 2 by RT PCR: NEGATIVE

## 2022-01-18 LAB — ETHANOL: Alcohol, Ethyl (B): <10 mg/dL (ref <10)

## 2022-01-18 LAB — ACETAMINOPHEN LEVEL: Acetaminophen (Tylenol), Serum: <10 ug/mL — ABNORMAL LOW (ref 10–30)

## 2022-01-18 LAB — SALICYLATE LEVEL: Salicylate Lvl: <7 mg/dL — ABNORMAL LOW (ref 7.0–30.0)

## 2022-01-18 MED ORDER — TRAZODONE HCL 100 MG PO TABS
100.0000 mg | ORAL_TABLET | Freq: Every day | ORAL | Status: DC
  Administered 2022-01-18 – 2022-01-19 (×2): 100 mg via ORAL
  Filled 2022-01-18 (×2): qty 1

## 2022-01-18 NOTE — ED Notes (Signed)
Pt moved to [facility] 

## 2022-01-18 NOTE — ED Triage Notes (Addendum)
Pt to ED POV with mother for SI and auditory hallucinations. Pt and mother state this is ongoing for years, worse since several days ago. Pt states is hearing people tell him to do things, but not bad things. Mother states that the other day he jumped on someone from hearing an auditory hallucinations. Pt does not have specific SI plan.  Pt does not take any psych meds. At one time was taking Adderal. Last time was hospitalized for mental health issues at [facility] was 3 years ago. No mental hx diagnoses in chart yet. Uses marijuana, cocaine, meth. Last cocaine and meth was 3-4 days ago.  Pt says is okay to give info on phone to mother Rachel Moulds. She has his phone. Pt appears restless.   Belongings include: Black hoodie Tan pants White long johns Blue boxers [place] colored slip on shoes Black watch

## 2022-01-18 NOTE — ED Provider Notes (Signed)
   [facility] Provider Note    Event Date/Time   First MD Initiated Contact with Patient 01/18/22 1921     (approximate)  History   Chief Complaint: Suicidal and auditory hallucinations  HPI  Michael Calderon is a 32 y.o. male with no known past medical history who admits to polysubstance abuse including cocaine and methamphetamine who presents to the emergency department for auditory hallucinations and suicidal ideation.  According to the patient over the past year or 2 he has been experiencing auditory hallucinations that have been worsening.  Patient states he hears these hallucinations whether or not he is using substances or not.  States his last methamphetamine use was approximately 2 or 3 days ago.  Denies any alcohol use.  No medical complaints today.  Patient does state he has been experiencing suicidal thoughts as well due to the worsening hallucinations.   Physical Exam   Triage Vital Signs: ED Triage Vitals  Enc Vitals Group     BP 01/18/22 1823 (!) 140/79     Pulse Rate 01/18/22 1823 91     Resp 01/18/22 1823 20     Temp 01/18/22 1823 97.6 F (36.4 C)     Temp Source 01/18/22 1823 Oral     SpO2 01/18/22 1823 99 %     Weight 01/18/22 1813 170 lb (77.1 kg)     Height 01/18/22 1813 5\' 6"  (1.676 m)     Head Circumference --      Peak Flow --      Pain Score 01/18/22 1813 0     Pain Loc --      Pain Edu? --      Excl. in [place]? --     Most recent vital signs: Vitals:   01/18/22 1823  BP: (!) 140/79  Pulse: 91  Resp: 20  Temp: 97.6 F (36.4 C)  SpO2: 99%    General: Awake, no distress.  CV:  Good peripheral perfusion.  Regular rate and rhythm  Resp:  Normal effort.  Equal breath sounds bilaterally.  Abd:  No distention.  Soft, nontender.  No rebound or guarding.   ED Results / Procedures / Treatments   MEDICATIONS ORDERED IN ED: Medications - No data to display   IMPRESSION / MDM / [place] / ED COURSE  I reviewed  the triage vital signs and the nursing notes.  Patient's presentation is most consistent with acute presentation with potential threat to life or bodily function.  Patient presents emergency department for worsening auditory hallucinations.  Patient admits to cocaine methamphetamine and marijuana use last use approximately 2 to 3 days ago.  Patient's lab work is largely reassuring with a normal CBC, normal chemistry, negative COVID/flu/RSV, negative alcohol, negative acetaminophen and salicylate.  Differential would include undiagnosed schizophrenia, substance-induced psychosis.  Will have psychiatry and TTS evaluate.  Will maintain an IVC until the patient can be adequately evaluated by psychiatry given the patient's suicidal ideation.  His lab work shows a negative COVID/flu/RSV, chemistry is reassuring, alcohol salicylate acetaminophen negative, CBC reassuring.  Awaiting psychiatric and TTS evaluation.  FINAL CLINICAL IMPRESSION(S) / ED DIAGNOSES   Substance abuse Psychosis Suicidal ideation    Note:  This document was prepared using Dragon voice recognition software and may include unintentional dictation errors.   Harvest Dark, MD 01/18/22 2104

## 2022-01-18 NOTE — ED Notes (Signed)
pt recieved snack and drink 

## 2022-01-18 NOTE — Consult Note (Signed)
[facility] Psychiatry Consult   Reason for Consult:  Psychiatric evalaution Referring Physician:  Dr. Kerman Passey Patient Identification: Michael Calderon MRN:  [id number] Principal Diagnosis: Methamphetamine abuse ([place]) Diagnosis:  Principal Problem:   Methamphetamine abuse ([place]) Active Problems:   Acute psychosis ([place])   Total Time spent with patient: 45 minutes  Subjective:   " I really just want the voices to stop"  HPI:  Michael Calderon, 32 y.o., male patient seen  by this provider; chart reviewed and consulted with Dr. Kerman Passey on 01/18/22.  Per chart review, triage note states, "Pt to ED POV with mother for SI and auditory hallucinations. Pt and mother state this is ongoing for years, worse since several days ago. Pt states is hearing people tell him to do things, but not bad things. Mother states that the other day he jumped on someone from hearing an auditory hallucinations. Pt does not have specific SI plan.   Pt does not take any psych meds. At one time was taking Adderal. Last time was hospitalized for mental health issues at [facility] was 3 years ago. No mental hx diagnoses in chart yet. Uses marijuana, cocaine, meth. Last cocaine and meth was 3-4 days ago."  On evaluation Michael Calderon reports that he is constantly hearing voices and directly points out that it has nothing to do with his drug abuse.  He says he last used methamphetamines three days ago. UDS is still pending at the time of this assessment.  Patient endorses SI without a plan due to voices in his head.  He states that he hasn't slept in 3-4 days due to racing thoughts.  Per chart review, he was hospitalized at [facility] for suicidal ideation with a diagnosis of paranoid schizophrenia in 2021. Per [facility]'s chart review in 2021, Patient is a 32 y.o. 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    During evaluation Michael Calderon is laying in bed and is easily engaged when asked to participate in the assessment. He is alert/oriented x 4; calm/cooperative; and mood congruent with affect.  Patient is speaking in a clear tone at moderate volume, and normal pace; with good eye contact. His thought process is coherent and relevant; There is no indication that he is currently responding to internal/external stimuli or experiencing delusional thought content.  Patient endorses suicidal/self-harm ideation and denies homicidal ideation. Patient endorse AH and states that the voices are so real that he acts on them often and just wants them to stop.  Patient remains psychotic and paranoid.    Recommendation: Inpatient psychiatric hospitalization  Past Psychiatric History: Substance abuse, SA  Risk to Self:   Risk to Others:   Prior Inpatient Therapy:   Prior Outpatient Therapy:    Past Medical History: History reviewed. No pertinent past medical history.  Past Surgical History:  Procedure Laterality Date   CHEST TUBE INSERTION     TONSILLECTOMY     TYMPANOPLASTY     Family History: History reviewed. No pertinent family history. Family Psychiatric  History:  Social History:  Social History   Substance and Sexual Activity  Alcohol Use Yes   Comment: occas     Social History   Substance and Sexual Activity  Drug Use Yes  Types: Marijuana, Methamphetamines, IV, Cocaine   Comment: last time cocaine, meth 3-4 days ago    Social History   Socioeconomic History   Marital status: Single    Spouse name: Not on file   Number of children: Not on file   Years of education: Not on file   Highest education level: Not on file  Occupational History   Not on file  Tobacco Use   Smoking status: Every Day    Packs/day: 0.50     Types: Cigarettes   Smokeless tobacco: Current    Types: Chew  Substance and Sexual Activity   Alcohol use: Yes    Comment: occas   Drug use: Yes    Types: Marijuana, Methamphetamines, IV, Cocaine    Comment: last time cocaine, meth 3-4 days ago   Sexual activity: Not on file  Other Topics Concern   Not on file  Social History Narrative   Not on file   Social Determinants of Health   Financial Resource Strain: Not on file  Food Insecurity: Not on file  Transportation Needs: Not on file  Physical Activity: Not on file  Stress: Not on file  Social Connections: Not on file   Additional Social History:    Allergies:   Allergies  Allergen Reactions   Sulfa Antibiotics Hives    Labs:  Results for orders placed or performed during the hospital encounter of 01/18/22 (from the past 48 hour(s))  Comprehensive metabolic panel     Status: Abnormal   Collection Time: 01/18/22  6:15 PM  Result Value Ref Range   Sodium 138 135 - 145 mmol/L   Potassium 4.3 3.5 - 5.1 mmol/L   Chloride 103 98 - 111 mmol/L   CO2 28 22 - 32 mmol/L   Glucose, Bld 85 70 - 99 mg/dL    Comment: Glucose reference range applies only to samples taken after fasting for at least 8 hours.   BUN 13 6 - 20 mg/dL   Creatinine, Ser 8.11 0.61 - 1.24 mg/dL   Calcium 8.7 (L) 8.9 - 10.3 mg/dL   Total Protein 6.7 6.5 - 8.1 g/dL   Albumin 3.8 3.5 - 5.0 g/dL   AST 19 15 - 41 U/L   ALT 16 0 - 44 U/L   Alkaline Phosphatase 47 38 - 126 U/L   Total Bilirubin 0.6 0.3 - 1.2 mg/dL   GFR, Estimated >91 >47 mL/min    Comment: (NOTE) Calculated using the CKD-EPI Creatinine Equation (2021)    Anion gap 7 5 - 15    Comment: Performed at [facility], [address], [place], [place] [address]  Ethanol     Status: None   Collection Time: 01/18/22  6:15 PM  Result Value Ref Range   Alcohol, Ethyl (B) <10 <10 mg/dL    Comment: (NOTE) Lowest detectable limit for serum alcohol is 10 mg/dL.  For medical purposes  only. Performed at [facility], [address] [place], [place] [address]   Salicylate level     Status: Abnormal   Collection Time: 01/18/22  6:15 PM  Result Value Ref Range   Salicylate Lvl <7.0 (L) 7.0 - 30.0 mg/dL    Comment: Performed at [facility], [address] [place], [place] [address]  Acetaminophen level     Status: Abnormal   Collection Time: 01/18/22  6:15 PM  Result Value Ref Range   Acetaminophen (Tylenol), Serum <10 (L) 10 - 30 ug/mL    Comment: (NOTE)  Therapeutic concentrations vary significantly. A range of 10-30 ug/mL  may be an effective concentration for many patients. However, some  are best treated at concentrations outside of this range. Acetaminophen concentrations >150 ug/mL at 4 hours after ingestion  and >50 ug/mL at 12 hours after ingestion are often associated with  toxic reactions.  Performed at [facility], [address] [place], [place] [address]   cbc     Status: None   Collection Time: 01/18/22  6:15 PM  Result Value Ref Range   WBC 10.0 4.0 - 10.5 K/uL   RBC 5.22 4.22 - 5.81 MIL/uL   Hemoglobin 15.3 13.0 - 17.0 g/dL   HCT 24.0 97.3 - 53.2 %   MCV 89.8 80.0 - 100.0 fL   MCH 29.3 26.0 - 34.0 pg   MCHC 32.6 30.0 - 36.0 g/dL   RDW 99.2 42.6 - 83.4 %   Platelets 338 150 - 400 K/uL   nRBC 0.0 0.0 - 0.2 %    Comment: Performed at [facility], [address], [place], [place] [address]  Resp panel by RT-PCR (RSV, Flu A&B, Covid) Anterior Nasal Swab     Status: None   Collection Time: 01/18/22  6:26 PM   Specimen: Anterior Nasal Swab  Result Value Ref Range   SARS Coronavirus 2 by RT PCR NEGATIVE NEGATIVE    Comment: (NOTE) SARS-CoV-2 target nucleic acids are NOT DETECTED.  The SARS-CoV-2 RNA is generally detectable in upper respiratory specimens during the acute phase of infection. The lowest concentration of SARS-CoV-2 viral copies this assay can detect is 138 copies/mL. A negative result  does not preclude SARS-Cov-2 infection and should not be used as the sole basis for treatment or other patient management decisions. A negative result may occur with  improper specimen collection/handling, submission of specimen other than nasopharyngeal swab, presence of viral mutation(s) within the areas targeted by this assay, and inadequate number of viral copies(<138 copies/mL). A negative result must be combined with clinical observations, patient history, and epidemiological information. The expected result is Negative.  Fact Sheet for Patients:  BloggerCourse.com  Fact Sheet for Healthcare Providers:  SeriousBroker.it  This test is no t yet approved or cleared by the [place] FDA and  has been authorized for detection and/or diagnosis of SARS-CoV-2 by FDA under an Emergency Use Authorization (EUA). This EUA will remain  in effect (meaning this test can be used) for the duration of the COVID-19 declaration under Section 564(b)(1) of the Act, 21 U.S.C.section 360bbb-3(b)(1), unless the authorization is terminated  or revoked sooner.       Influenza A by PCR NEGATIVE NEGATIVE   Influenza B by PCR NEGATIVE NEGATIVE    Comment: (NOTE) The Xpert Xpress SARS-CoV-2/FLU/RSV plus assay is intended as an aid in the diagnosis of influenza from Nasopharyngeal swab specimens and should not be used as a sole basis for treatment. Nasal washings and aspirates are unacceptable for Xpert Xpress SARS-CoV-2/FLU/RSV testing.  Fact Sheet for Patients: BloggerCourse.com  Fact Sheet for Healthcare Providers: SeriousBroker.it  This test is not yet approved or cleared by the [place] FDA and has been authorized for detection and/or diagnosis of SARS-CoV-2 by FDA under an Emergency Use Authorization (EUA). This EUA will remain in effect (meaning this test can be used) for the duration  of the COVID-19 declaration under Section 564(b)(1) of the Act, 21 U.S.C. section 360bbb-3(b)(1), unless the authorization is terminated or revoked.     Resp Syncytial Virus by PCR  NEGATIVE NEGATIVE    Comment: (NOTE) Fact Sheet for Patients: EntrepreneurPulse.com.au  Fact Sheet for Healthcare Providers: IncredibleEmployment.be  This test is not yet approved or cleared by the [place] FDA and has been authorized for detection and/or diagnosis of SARS-CoV-2 by FDA under an Emergency Use Authorization (EUA). This EUA will remain in effect (meaning this test can be used) for the duration of the COVID-19 declaration under Section 564(b)(1) of the Act, 21 U.S.C. section 360bbb-3(b)(1), unless the authorization is terminated or revoked.  Performed at [facility], [address] [place], [place] [address]     Current Facility-Administered Medications  Medication Dose Route Frequency Provider Last Rate Last Admin   traZODone (DESYREL) tablet 100 mg  100 mg Oral QHS Laporcha Marchesi M, NP   100 mg at 01/18/22 2237   Current Outpatient Medications  Medication Sig Dispense Refill   magic mouthwash SOLN Take 5 mLs by mouth 3 (three) times daily as needed for mouth pain. (Patient not taking: Reported on 01/18/2022) 75 mL 0    Musculoskeletal: Strength & Muscle Tone: within normal limits Gait & Station: normal Patient leans: N/A  Psychiatric Specialty Exam:  Presentation  General Appearance:  Appropriate for Environment; Casual  Eye Contact: Fair  Speech: Clear and Coherent  Speech Volume: Decreased  Handedness: Right   Mood and Affect  Mood: Depressed; Dysphoric; Irritable  Affect: Depressed   Thought Process  Thought Processes: Coherent  Descriptions of Associations:Intact  Orientation:Full (Time, Place and Person)  Thought Content:Logical  History of Schizophrenia/Schizoaffective disorder:No data  recorded Duration of Psychotic Symptoms:No data recorded Hallucinations:Hallucinations: Auditory Description of Auditory Hallucinations: Tells him things to do. Says they are annoying  Ideas of Reference:None  Suicidal Thoughts:Suicidal Thoughts: Yes, Passive  Homicidal Thoughts:Homicidal Thoughts: No   Sensorium  Memory: Immediate Fair; Remote Fair  Judgment: Poor  Insight: Fair   Materials engineer: Fair  Attention Span: Fair  Recall: [employer] of Knowledge: Fair  Language: Fair   Psychomotor Activity  Psychomotor Activity: Psychomotor Activity: Normal   Assets  Assets: Armed forces logistics/support/administrative officer; Desire for Improvement; Financial Resources/Insurance; Housing; Resilience   Sleep  Sleep: Sleep: Poor   Physical Exam: Physical Exam Vitals and nursing note reviewed.  Constitutional:      Appearance: Normal appearance.  HENT:     Head: Normocephalic and atraumatic.     Nose: Nose normal.     Mouth/Throat:     Mouth: Mucous membranes are dry.  Eyes:     Extraocular Movements: Extraocular movements intact.  Pulmonary:     Effort: Pulmonary effort is normal.  Musculoskeletal:        General: Normal range of motion.     Cervical back: Normal range of motion.  Skin:    General: Skin is warm and dry.  Neurological:     General: No focal deficit present.     Mental Status: He is alert and oriented to person, place, and time.  Psychiatric:        Attention and Perception: Attention normal. He perceives auditory hallucinations.        Mood and Affect: Mood is anxious and depressed.        Speech: Speech normal.        Behavior: Behavior is cooperative.        Thought Content: Thought content is paranoid. Thought content includes suicidal ideation. Thought content does not include homicidal ideation. Thought content does not include suicidal plan.  Cognition and Memory: Cognition and memory normal.        Judgment: Judgment is  impulsive.    Review of Systems  Psychiatric/Behavioral:  Positive for depression, hallucinations, substance abuse and suicidal ideas. The patient has insomnia.   All other systems reviewed and are negative.  Blood pressure (!) 140/79, pulse 91, temperature 97.6 F (36.4 C), temperature source Oral, resp. rate 20, height 5\' 6"  (1.676 m), weight 77.1 kg, SpO2 99 %. Body mass index is 27.44 kg/m.  Treatment Plan Summary: Daily contact with patient to assess and evaluate symptoms and progress in treatment and Medication management  Disposition: Recommend psychiatric Inpatient admission when medically cleared. Supportive therapy provided about ongoing stressors. Discussed crisis plan, support from social network, calling 911, coming to the Emergency Department, and calling Suicide Hotline.  , NP 01/18/2022 10:44 PM

## 2022-01-18 NOTE — ED Notes (Signed)
IVC/pending psych consult

## 2022-01-18 NOTE — ED Notes (Signed)
Assumed care of patient,  Oriented to unit regarding rounding and cameras.  Patient verbalized understanding.

## 2022-01-19 DIAGNOSIS — F15259 Other stimulant dependence with stimulant-induced psychotic disorder, unspecified: Secondary | ICD-10-CM

## 2022-01-19 MED ORDER — HALOPERIDOL 0.5 MG PO TABS
2.0000 mg | ORAL_TABLET | Freq: Two times a day (BID) | ORAL | Status: DC
  Administered 2022-01-19 – 2022-01-20 (×3): 2 mg via ORAL
  Filled 2022-01-19 (×3): qty 4

## 2022-01-19 MED ORDER — GABAPENTIN 300 MG PO CAPS
300.0000 mg | ORAL_CAPSULE | Freq: Three times a day (TID) | ORAL | Status: DC
  Administered 2022-01-19 – 2022-01-20 (×4): 300 mg via ORAL
  Filled 2022-01-19 (×4): qty 1

## 2022-01-19 NOTE — ED Notes (Signed)
Patient had bowel movement on self and bed linens.  Patient given fresh scrubs and clean bed linens.

## 2022-01-19 NOTE — BH Assessment (Signed)
Comprehensive Clinical Assessment (CCA) Screening, Triage and Referral Note  01/19/2022 Michael Calderon [id number]  Chief Complaint:  Chief Complaint  Patient presents with   Suicidal   auditory hallucinations   Visit Diagnosis: Methamphetamine induced psychosis  Michael Calderon. Michael Calderon is a 32 year old male who presents to the ER due to hearing voices. He states, he started hearing voices approximately three years ago. However, it has increased since his use of Methamphetamine use. Patient denies SI/HI and V/H. He admits to the use of methamphetamine, cocaine and cannabis. During the interview, his speech was slurred and he was restless.  Patient Reported Information How did you hear about [place]? Self  What Is the Reason for Your Visit/Call Today? Hearing voices and Meth Use  How Long Has This Been Causing You Problems? > than 6 months  What Do You Feel Would Help You the Most Today? Alcohol or Drug Use Treatment; Treatment for Depression or other mood problem   Have You Recently Had Any Thoughts About Hurting Yourself? No  Are You Planning to Commit Suicide/Harm Yourself At This time? No   Have you Recently Had Thoughts About [place]? No  Are You Planning to Harm Someone at This Time? No  Explanation: No data recorded  Have You Used Any Alcohol or Drugs in the Past 24 Hours? Yes  How Long Ago Did You Use Drugs or Alcohol? No data recorded What Did You Use and How Much? Methamphetamine, cocaine and cannabis   Do You Currently Have a Therapist/Psychiatrist? No  Name of Therapist/Psychiatrist: No data recorded  Have You Been Recently Discharged From Any Office Practice or Programs? No  Explanation of Discharge From Practice/Program: No data recorded   CCA Screening Triage Referral Assessment Type of Contact: Face-to-Face  Telemedicine Service Delivery:   Is this Initial or Reassessment?   Date Telepsych consult ordered in [facility]:    Time Telepsych consult  ordered in [facility]:    [facility] of Assessment[facility] ED  Provider Location: [facility] ED    Collateral Involvement: No data recorded  Does Patient Have a [place]? No data recorded Name and Contact of Legal Guardian: No data recorded If Minor and Not Living with Parent(s), Who has Custody? No data recorded Is CPS involved or ever been involved? Never  Is APS involved or ever been involved? Never   Patient Determined To Be At Risk for Harm To Self or Others Based on Review of Patient Reported Information or Presenting Complaint? No data recorded Method: No Plan  Availability of Means: No data recorded Intent: No data recorded Notification Required: No data recorded Additional Information for Danger to Others Potential: No data recorded Additional Comments for Danger to Others Potential: No data recorded Are There Guns or Other Weapons in Your Home? No data recorded Types of Guns/Weapons: No data recorded Are These Weapons Safely Secured?                            No data recorded Who Could Verify You Are Able To Have These Secured: No data recorded Do You Have any Outstanding Charges, Pending Court Dates, Parole/Probation? No data recorded Contacted To Inform of Risk of Harm To Self or Others: No data recorded  Does Patient Present under Involuntary Commitment? Yes   [place] of Residence: [place]   Patient Currently Receiving the Following Services: Not Receiving Services   Determination of Need: Emergent (2 hours)   Options For Referral:  No data recorded  Discharge Disposition:     Gunnar Fusi MS, LCAS, [facility], [facility] Therapeutic Triage Specialist 01/19/2022 11:25 AM

## 2022-01-19 NOTE — ED Notes (Signed)
pt recieved snack and drink 

## 2022-01-19 NOTE — ED Notes (Signed)
ivc/moved to [facility] 4/consult done/recommends psychiatric admission when medically cleared.Michael Calderon

## 2022-01-19 NOTE — ED Notes (Signed)
Patient is IVC pending inpatient admit when medically cleared 

## 2022-01-19 NOTE — Progress Notes (Signed)
Client will be re-evaluated tomorrow as his symptoms are improving.  Waylan Boga, PMHNP

## 2022-01-19 NOTE — ED Notes (Signed)
Pt denies SI/HI/AVH on assessment. Observed to be restless and jittery on assessment but denies any problems stating "I'm good".

## 2022-01-19 NOTE — ED Notes (Signed)
Pt reports no SI, HI, AVH. Calm and cooperative, speech clear
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
## 2022-01-19 NOTE — ED Provider Notes (Signed)
Emergency Medicine Observation Re-evaluation Note  Michael Calderon is a 32 y.o. male, seen on rounds today.  Pt initially presented to the ED for complaints of Suicidal and auditory hallucinations Currently, the patient is watching TV in his room.  Physical Exam  BP (!) 144/90 (BP Location: Left Arm)   Pulse 96   Temp 97.7 F (36.5 C) (Oral)   Resp 20   Ht 5\' 6"  (1.676 m)   Wt 77.1 kg   SpO2 97%   BMI 27.44 kg/m  Physical Exam General: No distress Lungs: Normal respiratory effort Psych: Calm and cooperative  ED Course / MDM   Recent changes in the last 24 hours include: None  Plan  Current plan is for observation and psychiatry reassessment on 1/15.    Arta Silence, MD 01/19/22 1540

## 2022-01-20 NOTE — ED Notes (Signed)
[facility] PAPERS  RESCINDED  PER  LOUISE  NP  INFORMED  DOROTHY RN

## 2022-01-20 NOTE — ED Provider Notes (Signed)
Emergency Medicine Observation Re-evaluation Note  Michael Calderon is a 32 y.o. male, seen on rounds today.  Pt initially presented to the ED for complaints of Suicidal and auditory hallucinations  Patient observed via video camera to be sleeping.  Nursing, Dorothy, and tech staff in the behavioral unit advising everyone is currently sleeping, and recommended not attempt to awaken them at this time to allow a full rest. Evidently some were up quite late on the unit.  They will notify to me if any concerns  Physical Exam  BP 129/78   Pulse 94   Temp 98.4 F (36.9 C) (Oral)   Resp 17   Ht 5\' 6"  (1.676 m)   Wt 77.1 kg   SpO2 98%   BMI 27.44 kg/m  Physical Exam General: No distress Lungs: Normal respiratory effort Psych:  sleeping  ED Course / MDM   Recent changes in the last 24 hours include: None  Plan  Current plan is for observation and psychiatry reassessment on 1/15.   Delman Kitten, MD 01/20/22 [phone]
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
## 2022-01-20 NOTE — ED Notes (Signed)
Pt denies SI/HI/Avh on assessment. Appears less jittery but paranoid.

## 2022-01-20 NOTE — ED Provider Notes (Signed)
-----------------------------------------   11:32 AM on 01/20/2022 -----------------------------------------   The patient has been clear by psychiatry and the IVC has been rescinded.  He is stable for discharge at this time.  Return precautions provided.   Arta Silence, MD 01/20/22 1133

## 2022-01-20 NOTE — ED Notes (Signed)
Pt discharging home. Discharge teaching done and pt verbalized understanding. Pt signed paper discharge form. Given all his personal belongings. Stable, ambulatory and in NAD. Escorted out to lobby.
